# Patient Record
Sex: Male | Born: 1940
Health system: Southern US, Community
[De-identification: ages and names within clinical notes are randomized; demographics above are authoritative.]

## PROBLEM LIST (undated history)

## (undated) DIAGNOSIS — I8390 Asymptomatic varicose veins of unspecified lower extremity: Secondary | ICD-10-CM

## (undated) DIAGNOSIS — I2510 Atherosclerotic heart disease of native coronary artery without angina pectoris: Secondary | ICD-10-CM

## (undated) DIAGNOSIS — K5730 Diverticulosis of large intestine without perforation or abscess without bleeding: Secondary | ICD-10-CM

## (undated) DIAGNOSIS — Z87442 Personal history of urinary calculi: Secondary | ICD-10-CM

## (undated) DIAGNOSIS — M1990 Unspecified osteoarthritis, unspecified site: Secondary | ICD-10-CM

## (undated) DIAGNOSIS — J4489 Other specified chronic obstructive pulmonary disease: Secondary | ICD-10-CM

## (undated) DIAGNOSIS — I10 Essential (primary) hypertension: Secondary | ICD-10-CM

## (undated) DIAGNOSIS — F17200 Nicotine dependence, unspecified, uncomplicated: Secondary | ICD-10-CM

## (undated) DIAGNOSIS — J449 Chronic obstructive pulmonary disease, unspecified: Secondary | ICD-10-CM

## (undated) DIAGNOSIS — E7800 Pure hypercholesterolemia, unspecified: Secondary | ICD-10-CM

## (undated) DIAGNOSIS — F411 Generalized anxiety disorder: Secondary | ICD-10-CM

## (undated) DIAGNOSIS — D126 Benign neoplasm of colon, unspecified: Secondary | ICD-10-CM

## (undated) DIAGNOSIS — Z8619 Personal history of other infectious and parasitic diseases: Secondary | ICD-10-CM

## (undated) HISTORY — DX: Personal history of other infectious and parasitic diseases: Z86.19

## (undated) HISTORY — DX: Benign neoplasm of colon, unspecified: D12.6

## (undated) HISTORY — DX: Chronic obstructive pulmonary disease, unspecified: J44.9

## (undated) HISTORY — DX: Other specified chronic obstructive pulmonary disease: J44.89

## (undated) HISTORY — DX: Diverticulosis of large intestine without perforation or abscess without bleeding: K57.30

## (undated) HISTORY — PX: TONSILLECTOMY: SHX5217

## (undated) HISTORY — PX: HERNIA REPAIR: SHX51

## (undated) HISTORY — DX: Atherosclerotic heart disease of native coronary artery without angina pectoris: I25.10

## (undated) HISTORY — DX: Personal history of urinary calculi: Z87.442

## (undated) HISTORY — DX: Asymptomatic varicose veins of unspecified lower extremity: I83.90

## (undated) HISTORY — DX: Nicotine dependence, unspecified, uncomplicated: F17.200

## (undated) HISTORY — DX: Generalized anxiety disorder: F41.1

## (undated) HISTORY — DX: Unspecified osteoarthritis, unspecified site: M19.90

## (undated) HISTORY — DX: Pure hypercholesterolemia, unspecified: E78.00

## (undated) HISTORY — DX: Essential (primary) hypertension: I10

---

## 1977-07-19 HISTORY — PX: OTHER SURGICAL HISTORY: SHX169

## 1988-07-19 HISTORY — PX: CYSTOSCOPY: SUR368

## 1997-07-19 HISTORY — PX: OTHER SURGICAL HISTORY: SHX169

## 2000-12-02 ENCOUNTER — Other Ambulatory Visit: Admission: RE | Admit: 2000-12-02 | Discharge: 2000-12-02 | Payer: Self-pay | Admitting: Internal Medicine

## 2000-12-02 ENCOUNTER — Encounter (INDEPENDENT_AMBULATORY_CARE_PROVIDER_SITE_OTHER): Payer: Self-pay | Admitting: *Deleted

## 2004-06-03 ENCOUNTER — Ambulatory Visit: Payer: Self-pay | Admitting: Pulmonary Disease

## 2004-07-23 ENCOUNTER — Ambulatory Visit: Payer: Self-pay | Admitting: Pulmonary Disease

## 2004-08-14 ENCOUNTER — Ambulatory Visit: Payer: Self-pay | Admitting: Pulmonary Disease

## 2004-09-30 ENCOUNTER — Ambulatory Visit: Payer: Self-pay | Admitting: Cardiology

## 2004-10-09 ENCOUNTER — Ambulatory Visit: Payer: Self-pay

## 2005-09-15 ENCOUNTER — Ambulatory Visit: Payer: Self-pay | Admitting: Pulmonary Disease

## 2006-05-25 ENCOUNTER — Ambulatory Visit: Payer: Self-pay | Admitting: Pulmonary Disease

## 2006-06-22 ENCOUNTER — Ambulatory Visit: Payer: Self-pay | Admitting: Internal Medicine

## 2006-07-06 ENCOUNTER — Ambulatory Visit: Payer: Self-pay | Admitting: Internal Medicine

## 2006-10-06 ENCOUNTER — Ambulatory Visit: Payer: Self-pay | Admitting: Pulmonary Disease

## 2006-10-06 LAB — CONVERTED CEMR LAB
Albumin: 3.8 g/dL (ref 3.5–5.2)
Alkaline Phosphatase: 78 units/L (ref 39–117)
BUN: 16 mg/dL (ref 6–23)
Bilirubin, Direct: 0.2 mg/dL (ref 0.0–0.3)
CO2: 30 meq/L (ref 19–32)
Chloride: 105 meq/L (ref 96–112)
Creatinine, Ser: 0.8 mg/dL (ref 0.4–1.5)
Eosinophils Absolute: 0.3 10*3/uL (ref 0.0–0.6)
GFR calc Af Amer: 125 mL/min
Glucose, Bld: 95 mg/dL (ref 70–99)
HDL: 44.6 mg/dL (ref >39.0)
Hemoglobin, Urine: NEGATIVE
LDL Cholesterol: 76 mg/dL (ref 0–99)
Leukocytes, UA: NEGATIVE
Lymphocytes Relative: 28.4 % (ref 12.0–46.0)
MCHC: 35.2 g/dL (ref 30.0–36.0)
Neutro Abs: 4 10*3/uL (ref 1.4–7.7)
PSA: 0.77 ng/mL (ref 0.10–4.00)
RDW: 12.2 % (ref 11.5–14.6)
Sodium: 140 meq/L (ref 135–145)
Specific Gravity, Urine: 1.02 (ref 1.000–1.03)
Triglycerides: 45 mg/dL (ref 0–149)
Urine Glucose: NEGATIVE mg/dL
pH: 7 (ref 5.0–8.0)

## 2006-11-18 ENCOUNTER — Ambulatory Visit: Payer: Self-pay | Admitting: Pulmonary Disease

## 2007-05-15 ENCOUNTER — Ambulatory Visit: Payer: Self-pay | Admitting: Pulmonary Disease

## 2007-09-11 ENCOUNTER — Telehealth: Payer: Self-pay | Admitting: Pulmonary Disease

## 2007-11-29 ENCOUNTER — Encounter: Payer: Self-pay | Admitting: Pulmonary Disease

## 2007-11-30 DIAGNOSIS — F411 Generalized anxiety disorder: Secondary | ICD-10-CM

## 2007-11-30 DIAGNOSIS — E7800 Pure hypercholesterolemia, unspecified: Secondary | ICD-10-CM | POA: Insufficient documentation

## 2007-11-30 DIAGNOSIS — I2510 Atherosclerotic heart disease of native coronary artery without angina pectoris: Secondary | ICD-10-CM

## 2007-11-30 DIAGNOSIS — D126 Benign neoplasm of colon, unspecified: Secondary | ICD-10-CM

## 2007-11-30 DIAGNOSIS — J449 Chronic obstructive pulmonary disease, unspecified: Secondary | ICD-10-CM

## 2007-11-30 DIAGNOSIS — Z87442 Personal history of urinary calculi: Secondary | ICD-10-CM | POA: Insufficient documentation

## 2007-11-30 DIAGNOSIS — M1990 Unspecified osteoarthritis, unspecified site: Secondary | ICD-10-CM | POA: Insufficient documentation

## 2007-11-30 DIAGNOSIS — I10 Essential (primary) hypertension: Secondary | ICD-10-CM

## 2008-02-07 ENCOUNTER — Ambulatory Visit: Payer: Self-pay | Admitting: Pulmonary Disease

## 2008-02-20 DIAGNOSIS — I8390 Asymptomatic varicose veins of unspecified lower extremity: Secondary | ICD-10-CM | POA: Insufficient documentation

## 2008-02-20 DIAGNOSIS — K5730 Diverticulosis of large intestine without perforation or abscess without bleeding: Secondary | ICD-10-CM

## 2009-02-07 ENCOUNTER — Ambulatory Visit: Payer: Self-pay | Admitting: Pulmonary Disease

## 2010-01-21 ENCOUNTER — Telehealth (INDEPENDENT_AMBULATORY_CARE_PROVIDER_SITE_OTHER): Payer: Self-pay | Admitting: *Deleted

## 2010-03-02 ENCOUNTER — Ambulatory Visit: Payer: Self-pay | Admitting: Pulmonary Disease

## 2010-03-08 LAB — CONVERTED CEMR LAB
ALT: 19 units/L (ref 0–53)
AST: 22 units/L (ref 0–37)
Albumin: 4.6 g/dL (ref 3.5–5.2)
Basophils Relative: 0.5 % (ref 0.0–3.0)
Calcium: 9.6 mg/dL (ref 8.4–10.5)
Cholesterol: 160 mg/dL (ref 0–200)
Creatinine, Ser: 0.9 mg/dL (ref 0.4–1.5)
Glucose, Bld: 93 mg/dL (ref 70–99)
HCT: 45.6 % (ref 39.0–52.0)
Hemoglobin: 15.9 g/dL (ref 13.0–17.0)
LDL Cholesterol: 92 mg/dL (ref 0–99)
MCHC: 34.8 g/dL (ref 30.0–36.0)
Monocytes Relative: 7.5 % (ref 3.0–12.0)
Neutrophils Relative %: 66.7 % (ref 43.0–77.0)
Potassium: 5.3 meq/L — ABNORMAL HIGH (ref 3.5–5.1)
RBC: 4.7 M/uL (ref 4.22–5.81)
RDW: 13.2 % (ref 11.5–14.6)
Sodium: 142 meq/L (ref 135–145)
TSH: 0.74 microintl units/mL (ref 0.35–5.50)
Total CHOL/HDL Ratio: 3
VLDL: 20.4 mg/dL (ref 0.0–40.0)

## 2010-03-09 DIAGNOSIS — I4949 Other premature depolarization: Secondary | ICD-10-CM

## 2010-03-11 ENCOUNTER — Telehealth: Payer: Self-pay | Admitting: Pulmonary Disease

## 2010-03-12 ENCOUNTER — Ambulatory Visit: Payer: Self-pay | Admitting: Internal Medicine

## 2010-03-12 DIAGNOSIS — R9431 Abnormal electrocardiogram [ECG] [EKG]: Secondary | ICD-10-CM

## 2010-03-18 ENCOUNTER — Telehealth (INDEPENDENT_AMBULATORY_CARE_PROVIDER_SITE_OTHER): Payer: Self-pay | Admitting: *Deleted

## 2010-03-19 ENCOUNTER — Encounter: Payer: Self-pay | Admitting: Cardiology

## 2010-03-19 ENCOUNTER — Ambulatory Visit: Payer: Self-pay | Admitting: Cardiology

## 2010-03-19 ENCOUNTER — Encounter (HOSPITAL_COMMUNITY): Admission: RE | Admit: 2010-03-19 | Discharge: 2010-04-13 | Payer: Self-pay | Admitting: Internal Medicine

## 2010-03-19 ENCOUNTER — Ambulatory Visit: Payer: Self-pay | Admitting: Internal Medicine

## 2010-03-19 ENCOUNTER — Ambulatory Visit: Payer: Self-pay

## 2010-03-30 ENCOUNTER — Encounter: Payer: Self-pay | Admitting: Internal Medicine

## 2010-04-30 ENCOUNTER — Encounter: Payer: Self-pay | Admitting: Pulmonary Disease

## 2010-06-19 ENCOUNTER — Emergency Department (HOSPITAL_BASED_OUTPATIENT_CLINIC_OR_DEPARTMENT_OTHER): Admission: EM | Admit: 2010-06-19 | Discharge: 2010-06-19 | Payer: Self-pay | Admitting: Emergency Medicine

## 2010-06-19 ENCOUNTER — Telehealth: Payer: Self-pay | Admitting: Pulmonary Disease

## 2010-07-09 ENCOUNTER — Telehealth: Payer: Self-pay | Admitting: Pulmonary Disease

## 2010-07-11 ENCOUNTER — Ambulatory Visit: Admit: 2010-07-11 | Payer: Self-pay | Admitting: Family Medicine

## 2010-07-14 ENCOUNTER — Telehealth: Payer: Self-pay | Admitting: Pulmonary Disease

## 2010-07-14 ENCOUNTER — Encounter: Payer: Self-pay | Admitting: Pulmonary Disease

## 2010-08-16 LAB — CONVERTED CEMR LAB
ALT: 18 units/L (ref 0–53)
Albumin: 4.5 g/dL (ref 3.5–5.2)
Alkaline Phosphatase: 86 units/L (ref 39–117)
BUN: 21 mg/dL (ref 6–23)
Basophils Absolute: 0 10*3/uL (ref 0.0–0.1)
Basophils Absolute: 0.1 10*3/uL (ref 0.0–0.1)
Basophils Relative: 0.7 % (ref 0.0–3.0)
Bilirubin Urine: NEGATIVE
Bilirubin, Direct: 0.2 mg/dL (ref 0.0–0.3)
Calcium: 9.4 mg/dL (ref 8.4–10.5)
Chloride: 106 meq/L (ref 96–112)
Cholesterol: 144 mg/dL (ref 0–200)
Cholesterol: 150 mg/dL (ref 0–200)
Eosinophils Absolute: 0.6 10*3/uL (ref 0.0–0.7)
Eosinophils Absolute: 0.7 10*3/uL (ref 0.0–0.7)
Eosinophils Relative: 5.7 % — ABNORMAL HIGH (ref 0.0–5.0)
GFR calc Af Amer: 96 mL/min
GFR calc non Af Amer: 79.17 mL/min (ref >60)
Glucose, Bld: 106 mg/dL — ABNORMAL HIGH (ref 70–99)
HDL: 43.6 mg/dL (ref >39.0)
Hemoglobin, Urine: NEGATIVE
Hemoglobin: 14.7 g/dL (ref 13.0–17.0)
LDL Cholesterol: 85 mg/dL (ref 0–99)
Lymphocytes Relative: 24.1 % (ref 12.0–46.0)
Lymphs Abs: 2.1 10*3/uL (ref 0.7–4.0)
MCHC: 33.8 g/dL (ref 30.0–36.0)
MCHC: 34.5 g/dL (ref 30.0–36.0)
MCV: 95.2 fL (ref 78.0–100.0)
Monocytes Absolute: 0.8 10*3/uL (ref 0.1–1.0)
Monocytes Absolute: 0.9 10*3/uL (ref 0.1–1.0)
Monocytes Relative: 9.1 % (ref 3.0–12.0)
Neutro Abs: 5.9 10*3/uL (ref 1.4–7.7)
Neutrophils Relative %: 58.3 % (ref 43.0–77.0)
Neutrophils Relative %: 60.8 % (ref 43.0–77.0)
PSA: 1.58 ng/mL (ref 0.10–4.00)
PSA: 3.17 ng/mL (ref 0.10–4.00)
Platelets: 205 10*3/uL (ref 150.0–400.0)
Potassium: 5 meq/L (ref 3.5–5.1)
RBC: 4.52 M/uL (ref 4.22–5.81)
RBC: 4.81 M/uL (ref 4.22–5.81)
RDW: 12.8 % (ref 11.5–14.6)
Specific Gravity, Urine: 1.02 (ref 1.000–1.03)
TSH: 0.56 microintl units/mL (ref 0.35–5.50)
Total Bilirubin: 1.3 mg/dL — ABNORMAL HIGH (ref 0.3–1.2)
Total CHOL/HDL Ratio: 3
Total Protein, Urine: NEGATIVE mg/dL
Triglycerides: 63 mg/dL (ref 0.0–149.0)
Urine Glucose: NEGATIVE mg/dL
VLDL: 12.6 mg/dL (ref 0.0–40.0)
WBC: 8.8 10*3/uL (ref 4.5–10.5)
WBC: 9.8 10*3/uL (ref 4.5–10.5)
pH: 6.5 (ref 5.0–8.0)

## 2010-08-18 NOTE — Progress Notes (Signed)
Summary: Nuclear pre procedure  Phone Note Outgoing Call Call back at [facility] Phone [phone]   Call placed by: Rea College, CMA,  March 18, 2010 4:05 PM Call placed to: Patient Summary of Call: Left message with information on Myoview Information Sheet (see scanned document for details).      Nuclear Med Background Indications for Stress Test: Evaluation for Ischemia, Abnormal EKG   History: COPD, Heart Catheterization, Myocardial Perfusion Study  History Comments: '06 JYN:[id number]  Symptoms: Chest Pain, DOE    Nuclear Pre-Procedure Cardiac Risk Factors: Hypertension, Lipids, Smoker Height (in): 72
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
## 2010-08-18 NOTE — Procedures (Signed)
Summary: summary Report  summary Report   Imported By: Erle Crocker 04/23/2010 13:11:35  _____________________________________________________________________  External Attachment:    Type:   Image     Comment:   External Document  Appended Document: summary Report adv pt results normal. pt wants to talk to dr. Graciela Husbands. Claris Gladden, RN, BSN

## 2010-08-18 NOTE — Progress Notes (Signed)
Summary: cardio appt  Phone Note Call from Patient Call back at Home Phone [phone]   Caller: Olegario Messier or Simona Huh Call For: nadel Reason for Call: Talk to Nurse Summary of Call: pt's wife comcerned that p's EKG was abnormal and cardiology appt was made for 08/30.  Cardiology has called to set pt's appointment back further into September.  Wife feels they shouldn't wait.  Can you do anything to get them a sooner appt.  They offered 09/09/ or 09/14 Initial call taken by: Eugene Gavia,  March 11, 2010 9:48 AM  Follow-up for Phone Call        Dr Kriste Basque, pt's wife wants him seen sooner by cards- they offered 9/14 appt- do you feel this is needed for him to be seen there sooner than this date? Pls advise, thanks! Follow-up by: Vernie Murders,  March 11, 2010 9:52 AM  Additional Follow-up for Phone Call Additional follow up Details #1::        rhonda called and got pt an appt for 8-25 and she will call them with the appt date and time [address] [facility]  March 11, 2010 2:55 PM      Appended Document: cardio appt Called pt to inform him that appt has been scheduled for tomorrow Thurs. 03/12/10 at 12:00 with Dr. Clide Cliff at [facility].  LMOAM of above message and ask that someone return my call to let me know that they are aware of this appt.

## 2010-08-18 NOTE — Progress Notes (Signed)
Summary: cyst / infection  Phone Note Call from Patient Call back at [facility] Phone [phone]   Caller: Patient Call For: nadel Summary of Call: pt c/o cyst on L shoulder (that pt has had for 20 yrs). cyst may be infected x 1 day- red/ warm to the touch with puss cyst is the size of his thumbnail. requests abx- [facility] [address]  Initial call taken by: Tivis Ringer, CNA,  June 19, 2010 2:12 PM  Follow-up for Phone Call        spoke wuith pt and he staes he has a cyst on his left shoulder that he has had for 20 yrs, but  yesterday he noticed it was red, warm to the touch, sore to touch, and he was able to squeeze some yellow puss out of it. He states he has been using warm salt water on it which has helped the soreness some. Pt states he has an appt with a general surgeon in a few months to have cyst removed. he is requesting an antiobiotic for infection. please advsie.Carron Curie CMA  June 19, 2010 4:00 PM   Additional Follow-up for Phone Call Additional follow up Details #1::        per SN---use hot soaks, keflex 500mg   #28  1 by mouth four times daily, if he feels like it needs to be lanced he can come in and see TP or with his surgeon.  thanks [address] [facility]  June 22, 2010 9:19 AM  lmomtcb   Philipp Deputy [facility]  June 23, 2010 4:58 PM     Additional Follow-up for Phone Call Additional follow up Details #2::    LMOMTCB Vernie Murders  June 22, 2010 9:27 AM    lmomtcb [address] [facility]  June 23, 2010 12:24 PM   lmtcb   Philipp Deputy [facility]  June 23, 2010 5:04 PM  Pt called back. says "no one called in any meds for him". he says he waited until 8:30 pm. he then went to [place]tr [place] and "was taken care of". pt just wanted to leave this msg for the nurse. he says he called back yesterday after 5pm. Tivis Ringer, CNA  June 24, 2010 9:06 AM  Additional Follow-up for Phone Call Additional follow up Details #3:: Details for Additional Follow-up Action  Taken: per pt nothing further needed.Carron Curie CMA  June 24, 2010 9:31 AM

## 2010-08-18 NOTE — Assessment & Plan Note (Addendum)
Summary: Cardiology Nuclear Testing  Nuclear Med Background Indications for Stress Test: Evaluation for Ischemia, Abnormal EKG   History: COPD, Heart Catheterization, Myocardial Perfusion Study  History Comments: '06 [id number]  Symptoms: Chest Pain, DOE, Palpitations    Nuclear Pre-Procedure Cardiac Risk Factors: Hypertension, Lipids, Smoker Caffeine/Decaff Intake: None NPO After: 9:30 PM Lungs: clear IV 0.9% NS with Angio Cath: 22g     IV Site: L Antecubital IV Started by: Irean Hong, RN Chest Size (in) 42     Height (in): 72 Weight (lb): 173 BMI: 23.55 Tech Comments: Held metoprolol 24 hrs.  Nuclear Med Study 1 or 2 day study:  1 day     Stress Test Type:  Stress Reading MD:  Olga Millers, MD     Referring MD:  S.Klein Resting Radionuclide:  Technetium 59m Tetrofosmin     Resting Radionuclide Dose:  11.0 mCi  Stress Radionuclide:  Technetium 17m Tetrofosmin     Stress Radionuclide Dose:  33.0 mCi   Stress Protocol Exercise Time (min):  7:33 min     Max HR:  136 bpm     Predicted Max HR:  152 bpm  Max Systolic BP: 181 mm Hg     Percent Max HR:  89.47 %     METS: 9.40 Rate Pressure Product:  66440    Stress Test Technologist:  Milana Na, EMT-P     Nuclear Technologist:  Domenic Polite, CNMT  Rest Procedure  Myocardial perfusion imaging was performed at rest 45 minutes following the intravenous administration of Technetium 25m Tetrofosmin.  Stress Procedure  The patient exercised for 7:33. The patient stopped due to fatigue and denied any chest pain.  There were no significant ST-T wave changes and freq pcvs/Bigemeny,Trigemeny.  Technetium 15m Tetrofosmin was injected at peak exercise and myocardial perfusion imaging was performed after a brief delay.  QPS Raw Data Images:  Acquisition technically good; normal left ventricular size. Stress Images:  There is decreased uptake in the inferior wall Rest Images:  There is decreased uptake in the inferior  wall. Subtraction (SDS):  No evidence of ischemia. Transient Ischemic Dilatation:  .93  (Normal <1.22)  Lung/Heart Ratio:  .31  (Normal <0.45)  Quantitative Gated Spect Images QGS EDV:  117 ml QGS ESV:  50 ml QGS EF:  57 % QGS cine images:  Normal wall motion.   Overall Impression  Exercise Capacity: Fair exercise capacity. BP Response: Normal blood pressure response. Clinical Symptoms: No chest pain ECG Impression: No significant ST segment change suggestive of ischemia; frequent pvcs and rare couplet. Overall Impression: Abnormal stress nuclear study with prior inferior infarct but no ischemia.  Appended Document: Cardiology Nuclear Testing RNA at both pt numbers. Claris Gladden, RN, BSN    Appended Document: Cardiology Nuclear Testing RNA at pt number. Claris Gladden, RN, BSN    Appended Document: Cardiology Nuclear Testing rna at pt home number.Claris Gladden, RN, BSN   04/14/10 1342  Appended Document: Cardiology Nuclear Testing adv pt of results and they expressed concern/dissapproval of not being notified before now. still concerned about irreg hr-r/t pvcs. had 24 holter monitor results reprinted and see several pvcs. records folks gone for day so unable to retrieve original to review notes from Dr. Graciela Husbands. Will discuss this w/him on Friday 10/7 Claris Gladden, RN, BSN
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
## 2010-08-18 NOTE — Miscellaneous (Signed)
Summary: med change--symbicort to qvar  Clinical Lists Changes  Medications: Changed medication from SYMBICORT 160-4.5 MCG/ACT  AERO (BUDESONIDE-FORMOTEROL FUMARATE) inhale 2 sprays two times a day... to QVAR 80 MCG/ACT AERS (BECLOMETHASONE DIPROPIONATE) 2 puffs two times a day - Signed Rx of QVAR 80 MCG/ACT AERS (BECLOMETHASONE DIPROPIONATE) 2 puffs two times a day;  #1 x 11;  Signed;  Entered by: Randell Loop CMA;  Authorized by: Michele Mcalpine MD;  Method used: Electronically to [facility]  [address] [id number] [address], [place], [place]  [address], Ph: [phone] or [phone], Fax: [phone]    called and spoke with pt about per SN and Dr. Barbarann Ehlers will change the symbicort to qvar 80  two times a day ---this has been sent to the pt pharmacy and pt is aware of med change Randell Loop [facility]  April 30, 2010 5:14 PM   Prescriptions: QVAR 80 MCG/ACT AERS (BECLOMETHASONE DIPROPIONATE) 2 puffs two times a day  #1 x 11   Entered by:   Randell Loop CMA   Authorized by:   Michele Mcalpine MD   Signed by:   Randell Loop CMA on 04/30/2010   Method used:   [facility]ronically to        [facility]  [address] [id number] (retail)       [address]       [place], [place]  [address]       Ph: [phone] or [phone]       Fax: [phone]   RxID:   [id number]

## 2010-08-18 NOTE — Progress Notes (Signed)
  Phone Note Other Incoming   Request: Send information Summary of Call: Request for records received from [facility]. Request forwarded to [facility].     

## 2010-08-20 NOTE — Progress Notes (Signed)
Summary: sick  Phone Note Call from Patient Call back at Home Phone [phone]   Caller: Patient Call For: nadel Reason for Call: Talk to Nurse Summary of Call: Patient calling c/o sinus issues, cough w/ yellow sputum, yellow mucus.  Asking for antibiotic.  [facility] [address] Initial call taken by: Lehman Prom,  July 09, 2010 10:35 AM  Follow-up for Phone Call        Spoke with pt.  He is c/o "stuffy head", sinus pressure, dry cough- worse at night, and yellow nasal d/c- onset was 4 days ago. Wants something called in. Wants cough med also to help him sleep since cough worse at bedtime. Allergic to prednisone only. Pls advise thanks Follow-up by: Vernie Murders,  July 09, 2010 2:03 PM  Additional Follow-up for Phone Call Additional follow up Details #1::        called and spoke with pt and he is aware per SN to take the augmentin that has been sent to the pharamcy and pt is to use mucinex 2 by mouth two times a day with plenty of fluids and nasal saline mist as needed .  pt voiced his understanding of this Randell Loop CMA  July 09, 2010 5:13 PM     New/Updated Medications: AUGMENTIN 875-125 MG TABS (AMOXICILLIN-POT CLAVULANATE) take one tablet by mouth two times a day until gone Prescriptions: AUGMENTIN 875-125 MG TABS (AMOXICILLIN-POT CLAVULANATE) take one tablet by mouth two times a day until gone  #14 x 0   Entered by:   Randell Loop CMA   Authorized by:   Michele Mcalpine MD   Signed by:   Randell Loop CMA on 07/09/2010   Method used:   [facility] to        [facility]  [address] [id number]       [address]       [place], [place]  [address]       Ph: [phone] or [phone]       Fax: [phone]   RxID:   [id number]

## 2010-08-20 NOTE — Letter (Signed)
Summary: Internal Correspondence  Internal Correspondence   Imported By: Valinda Hoar 07/14/2010 15:45:09  _____________________________________________________________________  External Attachment:    Type:   Image     Comment:   External Document

## 2010-08-20 NOTE — Progress Notes (Signed)
Summary: see note  Phone Note Call from Patient Call back at Home Phone [phone]   Caller: Patient Call For: nadel Summary of Call: see internal correspondance note dated 12/27 with [facility] name on it please advise Initial call taken by: Lacinda Axon,  July 14, 2010 3:46 PM  Follow-up for Phone Call        Attached are the call reports from over the weekend call regarding the patient-please advise.Reynaldo Minium CMA  July 14, 2010 3:56 PM   Additional Follow-up for Phone Call Additional follow up Details #1::        lmomtcb x 2 to see how pt is doing? per SN Randell Loop CMA  July 15, 2010 3:42 PM     Additional Follow-up for Phone Call Additional follow up Details #2::    lmomtcb---many messages have been left for pt to contact office to see how he is doing with no contact ever made.  will sign off on message and wait for pt to return call. Randell Loop [facility]  July 17, 2010 1:46 PM

## 2010-08-28 ENCOUNTER — Ambulatory Visit (INDEPENDENT_AMBULATORY_CARE_PROVIDER_SITE_OTHER): Payer: Medicare Other | Admitting: Internal Medicine

## 2010-08-28 ENCOUNTER — Encounter: Payer: Self-pay | Admitting: Internal Medicine

## 2010-08-28 DIAGNOSIS — R072 Precordial pain: Secondary | ICD-10-CM

## 2010-08-28 DIAGNOSIS — I4949 Other premature depolarization: Secondary | ICD-10-CM

## 2010-08-28 DIAGNOSIS — R9431 Abnormal electrocardiogram [ECG] [EKG]: Secondary | ICD-10-CM

## 2010-09-01 ENCOUNTER — Telehealth: Payer: Self-pay | Admitting: Pulmonary Disease
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
## 2010-09-09 NOTE — Progress Notes (Signed)
  Phone Note Refill Request Message from:  Fax from Pharmacy  Refills Requested: Medication #1:  TEMAZEPAM 30 MG  CAPS 1 by mouth at bedtime as needed for sleep Initial call taken by: Zackery Barefoot CMA,  September 01, 2010 9:37 AM    Prescriptions: TEMAZEPAM 30 MG  CAPS (TEMAZEPAM) 1 by mouth at bedtime as needed for sleep  #30 x 5   Entered by:   Zackery Barefoot CMA   Authorized by:   Michele Mcalpine MD   Signed by:   Zackery Barefoot CMA on 09/01/2010   Method used:   Telephoned to ...       [facility]  [id number] (retail)       [address]       [place], [place]  [address]       Ph: [phone] or [phone]       Fax: [phone]   RxID:   [id number]

## 2010-09-29 LAB — WOUND CULTURE: Culture: NO GROWTH

## 2011-02-12 ENCOUNTER — Other Ambulatory Visit: Payer: Self-pay | Admitting: Pulmonary Disease

## 2011-02-12 MED ORDER — ALPRAZOLAM 0.5 MG PO TABS: ORAL_TABLET | ORAL | Status: DC

## 2011-02-12 NOTE — Telephone Encounter (Signed)
Pt is scheduled for f/u with SN on 03/03/2011. Will refill for # 90 with no refills.

## 2011-03-03 ENCOUNTER — Ambulatory Visit (INDEPENDENT_AMBULATORY_CARE_PROVIDER_SITE_OTHER)
Admission: RE | Admit: 2011-03-03 | Discharge: 2011-03-03 | Disposition: A | Discharge status: Home / Self Care | Payer: Medicare Other | Source: Ambulatory Visit | Attending: Pulmonary Disease | Admitting: Pulmonary Disease

## 2011-03-03 ENCOUNTER — Other Ambulatory Visit (INDEPENDENT_AMBULATORY_CARE_PROVIDER_SITE_OTHER): Payer: Medicare Other

## 2011-03-03 ENCOUNTER — Encounter: Payer: Self-pay | Admitting: Pulmonary Disease

## 2011-03-03 ENCOUNTER — Ambulatory Visit (INDEPENDENT_AMBULATORY_CARE_PROVIDER_SITE_OTHER): Payer: Medicare Other | Admitting: Pulmonary Disease

## 2011-03-03 DIAGNOSIS — F411 Generalized anxiety disorder: Secondary | ICD-10-CM

## 2011-03-03 DIAGNOSIS — N139 Obstructive and reflux uropathy, unspecified: Secondary | ICD-10-CM

## 2011-03-03 DIAGNOSIS — J449 Chronic obstructive pulmonary disease, unspecified: Secondary | ICD-10-CM

## 2011-03-03 DIAGNOSIS — Z87442 Personal history of urinary calculi: Secondary | ICD-10-CM

## 2011-03-03 DIAGNOSIS — R9431 Abnormal electrocardiogram [ECG] [EKG]: Secondary | ICD-10-CM

## 2011-03-03 DIAGNOSIS — E7800 Pure hypercholesterolemia, unspecified: Secondary | ICD-10-CM

## 2011-03-03 DIAGNOSIS — F17200 Nicotine dependence, unspecified, uncomplicated: Secondary | ICD-10-CM

## 2011-03-03 DIAGNOSIS — I10 Essential (primary) hypertension: Secondary | ICD-10-CM

## 2011-03-03 DIAGNOSIS — K5730 Diverticulosis of large intestine without perforation or abscess without bleeding: Secondary | ICD-10-CM

## 2011-03-03 DIAGNOSIS — M1990 Unspecified osteoarthritis, unspecified site: Secondary | ICD-10-CM

## 2011-03-03 DIAGNOSIS — I4949 Other premature depolarization: Secondary | ICD-10-CM

## 2011-03-03 DIAGNOSIS — D126 Benign neoplasm of colon, unspecified: Secondary | ICD-10-CM

## 2011-03-03 DIAGNOSIS — I2510 Atherosclerotic heart disease of native coronary artery without angina pectoris: Secondary | ICD-10-CM

## 2011-03-03 DIAGNOSIS — G4700 Insomnia, unspecified: Secondary | ICD-10-CM | POA: Insufficient documentation

## 2011-03-03 DIAGNOSIS — I8390 Asymptomatic varicose veins of unspecified lower extremity: Secondary | ICD-10-CM

## 2011-03-03 LAB — TSH: TSH: 0.82 u[IU]/mL (ref 0.35–5.50)

## 2011-03-03 LAB — CBC WITH DIFFERENTIAL/PLATELET
Basophils Absolute: 0 10*3/uL (ref 0.0–0.1)
HCT: 43.8 % (ref 39.0–52.0)
Lymphs Abs: 2.1 10*3/uL (ref 0.7–4.0)
MCV: 96.6 fl (ref 78.0–100.0)
Monocytes Absolute: 0.8 10*3/uL (ref 0.1–1.0)
Platelets: 228 10*3/uL (ref 150.0–400.0)
RDW: 13.3 % (ref 11.5–14.6)

## 2011-03-03 LAB — LIPID PANEL
LDL Cholesterol: 82 mg/dL (ref 0–99)
VLDL: 20.6 mg/dL (ref 0.0–40.0)

## 2011-03-03 LAB — PSA: PSA: 1.34 ng/mL (ref 0.10–4.00)

## 2011-03-03 LAB — HEPATIC FUNCTION PANEL
AST: 20 U/L (ref 0–37)
Alkaline Phosphatase: 92 U/L (ref 39–117)
Total Bilirubin: 1.1 mg/dL (ref 0.3–1.2)

## 2011-03-03 LAB — BASIC METABOLIC PANEL
GFR: 71.24 mL/min (ref >60.00)
Potassium: 4.9 mEq/L (ref 3.5–5.1)
Sodium: 141 mEq/L (ref 135–145)

## 2011-03-03 MED ORDER — NABUMETONE 750 MG PO TABS: 750.0000 mg | ORAL_TABLET | Freq: Two times a day (BID) | ORAL | Status: DC | PRN

## 2011-03-03 MED ORDER — ALPRAZOLAM 0.5 MG PO TABS: ORAL_TABLET | ORAL | Status: DC

## 2011-03-03 MED ORDER — TEMAZEPAM 30 MG PO CAPS: 30.0000 mg | ORAL_CAPSULE | Freq: Every evening | ORAL | Status: DC | PRN

## 2011-03-03 MED ORDER — METOPROLOL SUCCINATE ER 50 MG PO TB24: 50.0000 mg | ORAL_TABLET | Freq: Every day | ORAL | Status: DC

## 2011-03-03 MED ORDER — ATORVASTATIN CALCIUM 10 MG PO TABS: ORAL_TABLET | ORAL | Status: DC

## 2011-03-03 NOTE — Patient Instructions (Signed)
Today we updated your med list in EPIC...    We refilled your meds as requested...  Jerome Craig, you have significant heart & lung disease, we are doing everything possible to prevent cumulative damage to your system over time, you need to quit the smoking to help yourself, let me know if you don't think you can quit on your own & if you want a prescription for Chantix... Call the 1-800-QUIT NOW phone number for additional help...  Today we did your follow up CXR, PFT, & fasting blood work...    Please call the PHONE TREE in a few days for your results...    Dial [id number] & when prompted enter your patient number followed by the # symbol...    Your patient number is:  [id number]#  Stay on your low cholesterol low fat diet & keep up the good work w/ exercise...  Call for any questions...  Let's plan a follow up eval in 1 year, sooner if needed for problems.Marland KitchenMarland Kitchen
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
## 2011-03-05 ENCOUNTER — Encounter: Payer: Self-pay | Admitting: Pulmonary Disease

## 2011-03-08 ENCOUNTER — Telehealth: Payer: Self-pay | Admitting: *Deleted

## 2011-03-08 NOTE — Telephone Encounter (Signed)
Per SN, PFT with moderate/severe airflow obstruction. Must quit smoking. Also rec start asmanex 2 sprays once daily and spiriva 1 capsule inhaled daily. I advised the pt of SN recs and he states that he would rather take the Qvar that he has 2 puffs twice daily. He states he was only taking it one puff every few days. He also states that he stopped smoking on 02-28-11. He request to try Qvar for 1 month to see how it does. Please advise. Carron Curie, CMA

## 2011-03-08 NOTE — Telephone Encounter (Signed)
Per SN ok to use Qvar but he also needs to use the spiriva daily in order to treat COPD. Pt states understanding. I have left a sample at front and advised the pt to ask for me so I can show him how to use the spiriva. Carron Curie, CMA

## 2011-08-30 ENCOUNTER — Other Ambulatory Visit: Payer: Self-pay | Admitting: Pulmonary Disease

## 2011-08-31 ENCOUNTER — Other Ambulatory Visit: Payer: Self-pay | Admitting: Pulmonary Disease

## 2011-09-01 ENCOUNTER — Telehealth: Payer: Self-pay | Admitting: Pulmonary Disease

## 2011-09-01 MED ORDER — TEMAZEPAM 30 MG PO CAPS: 30.0000 mg | ORAL_CAPSULE | Freq: Every evening | ORAL | Status: DC | PRN

## 2011-09-01 NOTE — Telephone Encounter (Signed)
Refill of the temazepam has been called to his pharmacy.  Called and spoke with pt and he is aware of meds called to the pharmacy.

## 2011-09-26 ENCOUNTER — Other Ambulatory Visit: Payer: Self-pay | Admitting: Pulmonary Disease

## 2011-10-06 DIAGNOSIS — D485 Neoplasm of uncertain behavior of skin: Secondary | ICD-10-CM | POA: Diagnosis not present

## 2011-10-06 DIAGNOSIS — H40059 Ocular hypertension, unspecified eye: Secondary | ICD-10-CM | POA: Diagnosis not present

## 2011-10-06 DIAGNOSIS — H25019 Cortical age-related cataract, unspecified eye: Secondary | ICD-10-CM | POA: Diagnosis not present

## 2011-10-06 DIAGNOSIS — H40019 Open angle with borderline findings, low risk, unspecified eye: Secondary | ICD-10-CM | POA: Diagnosis not present

## 2011-10-07 ENCOUNTER — Other Ambulatory Visit: Payer: Self-pay | Admitting: Pulmonary Disease

## 2011-10-19 ENCOUNTER — Emergency Department (HOSPITAL_BASED_OUTPATIENT_CLINIC_OR_DEPARTMENT_OTHER)
Admission: EM | Admit: 2011-10-19 | Discharge: 2011-10-19 | Disposition: A | Discharge status: Home / Self Care | Payer: Medicare Other | Attending: Emergency Medicine | Admitting: Emergency Medicine

## 2011-10-19 ENCOUNTER — Encounter (HOSPITAL_BASED_OUTPATIENT_CLINIC_OR_DEPARTMENT_OTHER): Payer: Self-pay | Admitting: *Deleted

## 2011-10-19 DIAGNOSIS — I10 Essential (primary) hypertension: Secondary | ICD-10-CM | POA: Diagnosis not present

## 2011-10-19 DIAGNOSIS — M1990 Unspecified osteoarthritis, unspecified site: Secondary | ICD-10-CM | POA: Diagnosis not present

## 2011-10-19 DIAGNOSIS — Z7982 Long term (current) use of aspirin: Secondary | ICD-10-CM | POA: Insufficient documentation

## 2011-10-19 DIAGNOSIS — J0110 Acute frontal sinusitis, unspecified: Secondary | ICD-10-CM | POA: Diagnosis not present

## 2011-10-19 DIAGNOSIS — J329 Chronic sinusitis, unspecified: Secondary | ICD-10-CM | POA: Diagnosis not present

## 2011-10-19 DIAGNOSIS — Z87891 Personal history of nicotine dependence: Secondary | ICD-10-CM | POA: Diagnosis not present

## 2011-10-19 DIAGNOSIS — J0100 Acute maxillary sinusitis, unspecified: Secondary | ICD-10-CM | POA: Diagnosis not present

## 2011-10-19 DIAGNOSIS — E7800 Pure hypercholesterolemia, unspecified: Secondary | ICD-10-CM | POA: Insufficient documentation

## 2011-10-19 MED ORDER — OXYMETAZOLINE HCL 0.05 % NA SOLN: 2.0000 | Freq: Two times a day (BID) | NASAL | Status: AC

## 2011-10-19 MED ORDER — CEFUROXIME AXETIL 250 MG PO TABS: 250.0000 mg | ORAL_TABLET | Freq: Two times a day (BID) | ORAL | Status: AC

## 2011-10-19 NOTE — ED Notes (Signed)
Pt. Reports sinus congestion with nose bleeds and sore eyes for approx. 17 days

## 2011-10-19 NOTE — Discharge Instructions (Signed)
Sinusitis Sinuses are air pockets within the bones of your face. The growth of bacteria within a sinus leads to infection. The infection prevents the sinuses from draining. This infection is called sinusitis. SYMPTOMS  There will be different areas of pain depending on which sinuses have become infected.  The maxillary sinuses often produce pain beneath the eyes.   Frontal sinusitis may cause pain in the middle of the forehead and above the eyes.  Other problems (symptoms) include:  Toothaches.   Colored, pus-like (purulent) drainage from the nose.   Swelling, warmth, and tenderness over the sinus areas may be signs of infection.  TREATMENT  Sinusitis is most often determined by an exam.X-rays may be taken. If x-rays have been taken, make sure you obtain your results or find out how you are to obtain them. Your caregiver may give you medications (antibiotics). These are medications that will help kill the bacteria causing the infection. You may also be given a medication (decongestant) that helps to reduce sinus swelling.  HOME CARE INSTRUCTIONS   Only take over-the-counter or prescription medicines for pain, discomfort, or fever as directed by your caregiver.   Drink extra fluids. Fluids help thin the mucus so your sinuses can drain more easily.   Applying either moist heat or ice packs to the sinus areas may help relieve discomfort.   Use saline nasal sprays to help moisten your sinuses. The sprays can be found at your local drugstore.  SEEK IMMEDIATE MEDICAL CARE IF:  You have a fever.   You have increasing pain, severe headaches, or toothache.   You have nausea, vomiting, or drowsiness.   You develop unusual swelling around the face or trouble seeing.  MAKE SURE YOU:   Understand these instructions.   Will watch your condition.   Will get help right away if you are not doing well or get worse.  Document Released: 07/05/2005 Document Revised: 06/24/2011 Document Reviewed:  02/01/2007 [facility] Patient Information 2012 [place], [place].
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
## 2012-03-02 ENCOUNTER — Ambulatory Visit (INDEPENDENT_AMBULATORY_CARE_PROVIDER_SITE_OTHER)
Admission: RE | Admit: 2012-03-02 | Discharge: 2012-03-02 | Disposition: A | Discharge status: Home / Self Care | Payer: Medicare Other | Source: Ambulatory Visit | Attending: Pulmonary Disease | Admitting: Pulmonary Disease

## 2012-03-02 ENCOUNTER — Encounter: Payer: Self-pay | Admitting: Pulmonary Disease

## 2012-03-02 ENCOUNTER — Other Ambulatory Visit (INDEPENDENT_AMBULATORY_CARE_PROVIDER_SITE_OTHER): Payer: Medicare Other

## 2012-03-02 ENCOUNTER — Ambulatory Visit (INDEPENDENT_AMBULATORY_CARE_PROVIDER_SITE_OTHER): Payer: Medicare Other | Admitting: Pulmonary Disease

## 2012-03-02 VITALS — BP 146/82 | HR 75 | Temp 96.9°F | Ht 72.0 in | Wt 176.0 lb

## 2012-03-02 DIAGNOSIS — E7800 Pure hypercholesterolemia, unspecified: Secondary | ICD-10-CM

## 2012-03-02 DIAGNOSIS — N320 Bladder-neck obstruction: Secondary | ICD-10-CM

## 2012-03-02 DIAGNOSIS — G4700 Insomnia, unspecified: Secondary | ICD-10-CM

## 2012-03-02 DIAGNOSIS — N139 Obstructive and reflux uropathy, unspecified: Secondary | ICD-10-CM

## 2012-03-02 DIAGNOSIS — J449 Chronic obstructive pulmonary disease, unspecified: Secondary | ICD-10-CM

## 2012-03-02 DIAGNOSIS — I10 Essential (primary) hypertension: Secondary | ICD-10-CM | POA: Diagnosis not present

## 2012-03-02 DIAGNOSIS — M1990 Unspecified osteoarthritis, unspecified site: Secondary | ICD-10-CM

## 2012-03-02 DIAGNOSIS — D126 Benign neoplasm of colon, unspecified: Secondary | ICD-10-CM | POA: Diagnosis not present

## 2012-03-02 DIAGNOSIS — F411 Generalized anxiety disorder: Secondary | ICD-10-CM | POA: Diagnosis not present

## 2012-03-02 DIAGNOSIS — I2510 Atherosclerotic heart disease of native coronary artery without angina pectoris: Secondary | ICD-10-CM | POA: Diagnosis not present

## 2012-03-02 DIAGNOSIS — I8390 Asymptomatic varicose veins of unspecified lower extremity: Secondary | ICD-10-CM | POA: Diagnosis not present

## 2012-03-02 DIAGNOSIS — K5730 Diverticulosis of large intestine without perforation or abscess without bleeding: Secondary | ICD-10-CM

## 2012-03-02 DIAGNOSIS — Z87442 Personal history of urinary calculi: Secondary | ICD-10-CM

## 2012-03-02 DIAGNOSIS — J4489 Other specified chronic obstructive pulmonary disease: Secondary | ICD-10-CM

## 2012-03-02 LAB — BASIC METABOLIC PANEL
BUN: 21 mg/dL (ref 6–23)
CO2: 30 mEq/L (ref 19–32)
GFR: 97.28 mL/min (ref >60.00)
Glucose, Bld: 90 mg/dL (ref 70–99)
Potassium: 5.1 mEq/L (ref 3.5–5.1)

## 2012-03-02 LAB — CBC WITH DIFFERENTIAL/PLATELET
Basophils Absolute: 0 10*3/uL (ref 0.0–0.1)
Eosinophils Relative: 7.7 % — ABNORMAL HIGH (ref 0.0–5.0)
HCT: 42.5 % (ref 39.0–52.0)
Lymphocytes Relative: 26.1 % (ref 12.0–46.0)
Monocytes Relative: 10 % (ref 3.0–12.0)
Neutrophils Relative %: 55.6 % (ref 43.0–77.0)
Platelets: 216 10*3/uL (ref 150.0–400.0)
WBC: 7.3 10*3/uL (ref 4.5–10.5)

## 2012-03-02 LAB — PSA: PSA: 1.28 ng/mL (ref 0.10–4.00)

## 2012-03-02 LAB — HEPATIC FUNCTION PANEL
ALT: 17 U/L (ref 0–53)
AST: 22 U/L (ref 0–37)
Albumin: 4.3 g/dL (ref 3.5–5.2)
Total Protein: 7.2 g/dL (ref 6.0–8.3)

## 2012-03-02 LAB — TSH: TSH: 0.85 u[IU]/mL (ref 0.35–5.50)

## 2012-03-02 LAB — LIPID PANEL
Cholesterol: 151 mg/dL (ref 0–200)
HDL: 49.4 mg/dL (ref >39.00)
VLDL: 22.8 mg/dL (ref 0.0–40.0)

## 2012-03-02 MED ORDER — ALPRAZOLAM 0.5 MG PO TABS: ORAL_TABLET | ORAL | Status: AC

## 2012-03-02 MED ORDER — TEMAZEPAM 30 MG PO CAPS: 30.0000 mg | ORAL_CAPSULE | Freq: Every evening | ORAL | Status: DC | PRN

## 2012-03-02 MED ORDER — ATORVASTATIN CALCIUM 10 MG PO TABS: 10.0000 mg | ORAL_TABLET | Freq: Every day | ORAL | Status: DC

## 2012-03-02 MED ORDER — TIOTROPIUM BROMIDE MONOHYDRATE 18 MCG IN CAPS: 18.0000 ug | ORAL_CAPSULE | Freq: Every day | RESPIRATORY_TRACT | Status: DC

## 2012-03-02 MED ORDER — NABUMETONE 750 MG PO TABS: 750.0000 mg | ORAL_TABLET | Freq: Two times a day (BID) | ORAL | Status: DC

## 2012-03-02 MED ORDER — METOPROLOL SUCCINATE ER 50 MG PO TB24: 50.0000 mg | ORAL_TABLET | Freq: Every day | ORAL | Status: DC
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
## 2012-03-02 NOTE — Patient Instructions (Addendum)
Today we updated your med list in our EPIC system...    Continue your current medications the same...    We refilled your meds per request...  Today we did your f/u CXR & FASTING blood work...    We will call you w/ the results when avail...  Call for any problems...  Continue your great exercise program!  Let's continue our yearly check ups, but call anytime if needed for problems.Marland KitchenMarland Kitchen

## 2012-03-04 ENCOUNTER — Encounter: Payer: Self-pay | Admitting: Pulmonary Disease

## 2012-04-11 DIAGNOSIS — H40019 Open angle with borderline findings, low risk, unspecified eye: Secondary | ICD-10-CM | POA: Diagnosis not present

## 2012-04-11 DIAGNOSIS — H40059 Ocular hypertension, unspecified eye: Secondary | ICD-10-CM | POA: Diagnosis not present

## 2012-04-13 DIAGNOSIS — Z23 Encounter for immunization: Secondary | ICD-10-CM | POA: Diagnosis not present

## 2012-08-31 ENCOUNTER — Other Ambulatory Visit: Payer: Self-pay | Admitting: Pulmonary Disease

## 2012-09-04 ENCOUNTER — Telehealth: Payer: Self-pay | Admitting: Pulmonary Disease

## 2012-09-04 NOTE — Telephone Encounter (Signed)
Temazepam called to the pharmacy and the pt is aware.

## 2012-10-10 DIAGNOSIS — H52209 Unspecified astigmatism, unspecified eye: Secondary | ICD-10-CM | POA: Diagnosis not present

## 2012-10-10 DIAGNOSIS — H40019 Open angle with borderline findings, low risk, unspecified eye: Secondary | ICD-10-CM | POA: Diagnosis not present

## 2012-10-10 DIAGNOSIS — D2310 Other benign neoplasm of skin of unspecified eyelid, including canthus: Secondary | ICD-10-CM | POA: Diagnosis not present

## 2012-10-10 DIAGNOSIS — H2510 Age-related nuclear cataract, unspecified eye: Secondary | ICD-10-CM | POA: Diagnosis not present

## 2012-10-16 ENCOUNTER — Other Ambulatory Visit: Payer: Self-pay | Admitting: Ophthalmology

## 2012-10-16 DIAGNOSIS — D2310 Other benign neoplasm of skin of unspecified eyelid, including canthus: Secondary | ICD-10-CM | POA: Diagnosis not present

## 2012-10-16 DIAGNOSIS — L820 Inflamed seborrheic keratosis: Secondary | ICD-10-CM | POA: Diagnosis not present

## 2013-03-05 ENCOUNTER — Other Ambulatory Visit (INDEPENDENT_AMBULATORY_CARE_PROVIDER_SITE_OTHER): Payer: Medicare Other

## 2013-03-05 ENCOUNTER — Encounter: Payer: Self-pay | Admitting: Pulmonary Disease

## 2013-03-05 ENCOUNTER — Ambulatory Visit (INDEPENDENT_AMBULATORY_CARE_PROVIDER_SITE_OTHER)
Admission: RE | Admit: 2013-03-05 | Discharge: 2013-03-05 | Disposition: A | Discharge status: Home / Self Care | Payer: Medicare Other | Source: Ambulatory Visit | Attending: Pulmonary Disease | Admitting: Pulmonary Disease

## 2013-03-05 ENCOUNTER — Ambulatory Visit (INDEPENDENT_AMBULATORY_CARE_PROVIDER_SITE_OTHER): Payer: Medicare Other | Admitting: Pulmonary Disease

## 2013-03-05 VITALS — BP 140/86 | HR 64 | Temp 97.7°F | Ht 72.0 in | Wt 178.8 lb

## 2013-03-05 DIAGNOSIS — Z87442 Personal history of urinary calculi: Secondary | ICD-10-CM

## 2013-03-05 DIAGNOSIS — M1990 Unspecified osteoarthritis, unspecified site: Secondary | ICD-10-CM

## 2013-03-05 DIAGNOSIS — N320 Bladder-neck obstruction: Secondary | ICD-10-CM | POA: Diagnosis not present

## 2013-03-05 DIAGNOSIS — J449 Chronic obstructive pulmonary disease, unspecified: Secondary | ICD-10-CM | POA: Diagnosis not present

## 2013-03-05 DIAGNOSIS — I2510 Atherosclerotic heart disease of native coronary artery without angina pectoris: Secondary | ICD-10-CM | POA: Diagnosis not present

## 2013-03-05 DIAGNOSIS — I8390 Asymptomatic varicose veins of unspecified lower extremity: Secondary | ICD-10-CM | POA: Diagnosis not present

## 2013-03-05 DIAGNOSIS — I10 Essential (primary) hypertension: Secondary | ICD-10-CM | POA: Diagnosis not present

## 2013-03-05 DIAGNOSIS — E7800 Pure hypercholesterolemia, unspecified: Secondary | ICD-10-CM

## 2013-03-05 DIAGNOSIS — K5730 Diverticulosis of large intestine without perforation or abscess without bleeding: Secondary | ICD-10-CM

## 2013-03-05 DIAGNOSIS — J4489 Other specified chronic obstructive pulmonary disease: Secondary | ICD-10-CM

## 2013-03-05 DIAGNOSIS — D126 Benign neoplasm of colon, unspecified: Secondary | ICD-10-CM

## 2013-03-05 DIAGNOSIS — I8391 Asymptomatic varicose veins of right lower extremity: Secondary | ICD-10-CM

## 2013-03-05 DIAGNOSIS — F411 Generalized anxiety disorder: Secondary | ICD-10-CM

## 2013-03-05 LAB — BASIC METABOLIC PANEL
CO2: 28 mEq/L (ref 19–32)
Calcium: 9.1 mg/dL (ref 8.4–10.5)
Chloride: 104 mEq/L (ref 96–112)
Creatinine, Ser: 1.1 mg/dL (ref 0.4–1.5)
Glucose, Bld: 89 mg/dL (ref 70–99)

## 2013-03-05 LAB — CBC WITH DIFFERENTIAL/PLATELET
Basophils Absolute: 0 10*3/uL (ref 0.0–0.1)
Basophils Relative: 0.5 % (ref 0.0–3.0)
Eosinophils Absolute: 0.5 10*3/uL (ref 0.0–0.7)
Hemoglobin: 14.5 g/dL (ref 13.0–17.0)
Lymphocytes Relative: 23.3 % (ref 12.0–46.0)
MCHC: 34.6 g/dL (ref 30.0–36.0)
Monocytes Relative: 9.7 % (ref 3.0–12.0)
Neutro Abs: 4.5 10*3/uL (ref 1.4–7.7)
Neutrophils Relative %: 59.7 % (ref 43.0–77.0)
RBC: 4.51 Mil/uL (ref 4.22–5.81)
RDW: 13 % (ref 11.5–14.6)

## 2013-03-05 LAB — LIPID PANEL
Cholesterol: 142 mg/dL (ref 0–200)
HDL: 42.3 mg/dL (ref >39.00)
Triglycerides: 85 mg/dL (ref 0.0–149.0)

## 2013-03-05 LAB — PSA: PSA: 1.32 ng/mL (ref 0.10–4.00)

## 2013-03-05 LAB — HEPATIC FUNCTION PANEL
ALT: 16 U/L (ref 0–53)
Albumin: 4.3 g/dL (ref 3.5–5.2)
Total Protein: 7.5 g/dL (ref 6.0–8.3)

## 2013-03-05 MED ORDER — METOPROLOL SUCCINATE ER 50 MG PO TB24: 50.0000 mg | ORAL_TABLET | Freq: Every day | ORAL | Status: DC

## 2013-03-05 MED ORDER — TEMAZEPAM 30 MG PO CAPS: ORAL_CAPSULE | ORAL | Status: DC

## 2013-03-05 MED ORDER — TIOTROPIUM BROMIDE MONOHYDRATE 18 MCG IN CAPS: 18.0000 ug | ORAL_CAPSULE | Freq: Every day | RESPIRATORY_TRACT | Status: DC

## 2013-03-05 MED ORDER — ATORVASTATIN CALCIUM 10 MG PO TABS: 10.0000 mg | ORAL_TABLET | ORAL | Status: DC

## 2013-03-05 MED ORDER — ALPRAZOLAM 0.5 MG PO TABS: ORAL_TABLET | ORAL | Status: DC

## 2013-03-05 MED ORDER — NABUMETONE 750 MG PO TABS: 750.0000 mg | ORAL_TABLET | Freq: Two times a day (BID) | ORAL | Status: DC

## 2013-03-05 MED ORDER — PANTOPRAZOLE SODIUM 40 MG PO TBEC: 40.0000 mg | DELAYED_RELEASE_TABLET | Freq: Every day | ORAL | Status: DC
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
## 2013-03-05 NOTE — Patient Instructions (Addendum)
Today we updated your med list in our EPIC system...    Continue your current medications the same...  We wrote a new prescription for PANTOPRAZOLE 40mg  tabs take one tab daily ~110min before a meal for your hyperacidity.Marland Kitchen.    (you can get #90 from [facility] for $20)...  Today we did your follow up CXR, EKG, & FASTING blood work...    We will contact you w/ the results when available...   Keep up the good work w/ diet & exercise...  Call for any questions...  Let's plan a follow up visit in 82yr, sooner if needed for problems.Marland KitchenMarland Kitchen

## 2013-04-30 DIAGNOSIS — Z23 Encounter for immunization: Secondary | ICD-10-CM | POA: Diagnosis not present

## 2013-05-11 DIAGNOSIS — H40059 Ocular hypertension, unspecified eye: Secondary | ICD-10-CM | POA: Diagnosis not present

## 2013-05-11 DIAGNOSIS — H40019 Open angle with borderline findings, low risk, unspecified eye: Secondary | ICD-10-CM | POA: Diagnosis not present

## 2013-08-31 ENCOUNTER — Other Ambulatory Visit: Payer: Self-pay | Admitting: Pulmonary Disease

## 2013-09-05 ENCOUNTER — Telehealth: Payer: Self-pay | Admitting: Pulmonary Disease

## 2013-09-05 MED ORDER — TEMAZEPAM 30 MG PO CAPS: ORAL_CAPSULE | ORAL | Status: DC

## 2013-09-05 NOTE — Telephone Encounter (Signed)
Rx has been called in. Pt is aware.

## 2013-10-02 ENCOUNTER — Other Ambulatory Visit: Payer: Self-pay | Admitting: Pulmonary Disease

## 2013-10-04 ENCOUNTER — Telehealth: Payer: Self-pay | Admitting: Pulmonary Disease

## 2013-10-04 NOTE — Telephone Encounter (Signed)
lmtcb x1 for pt Need to make pt aware SN retiring from [facility] 10/17/13. Need to set up with new PCP. Has pending appt in sept. Can still be seen for COPD.

## 2013-10-04 NOTE — Telephone Encounter (Signed)
Pt calling in regards to left msg from our office . Mindy contact patient about xanax rx refill that we received Explained to the patient that Dr Lenna Gilford is retiring from primary care as of April 1, will only do Pulmonary in office thereafter. Appt scheduled with Lenna Gilford 03/06/14 for ROV 1 yr--explained to the patient that we could keep this appt scheduled but for pulmonary reasons only--COPD and 1 yr cxr f/u. Pt expressed understanding Will refill Xanax x 51mo until patient can get established with new PCP. Patient has been given numbers to 3 locations to contact and try to establish PC-- [place], [place] and [place]. Pt will contact our office if anything further needed.

## 2013-10-09 DIAGNOSIS — H3530 Unspecified macular degeneration: Secondary | ICD-10-CM | POA: Diagnosis not present

## 2013-10-09 DIAGNOSIS — H40059 Ocular hypertension, unspecified eye: Secondary | ICD-10-CM | POA: Diagnosis not present

## 2013-10-09 DIAGNOSIS — H2510 Age-related nuclear cataract, unspecified eye: Secondary | ICD-10-CM | POA: Diagnosis not present

## 2013-10-09 DIAGNOSIS — H40019 Open angle with borderline findings, low risk, unspecified eye: Secondary | ICD-10-CM | POA: Diagnosis not present

## 2014-03-06 ENCOUNTER — Ambulatory Visit (INDEPENDENT_AMBULATORY_CARE_PROVIDER_SITE_OTHER): Payer: Medicare Other | Admitting: Pulmonary Disease

## 2014-03-06 ENCOUNTER — Ambulatory Visit (INDEPENDENT_AMBULATORY_CARE_PROVIDER_SITE_OTHER)
Admission: RE | Admit: 2014-03-06 | Discharge: 2014-03-06 | Disposition: A | Discharge status: Home / Self Care | Payer: Medicare Other | Source: Ambulatory Visit | Attending: Pulmonary Disease | Admitting: Pulmonary Disease

## 2014-03-06 ENCOUNTER — Telehealth: Payer: Self-pay | Admitting: Pulmonary Disease

## 2014-03-06 ENCOUNTER — Other Ambulatory Visit (INDEPENDENT_AMBULATORY_CARE_PROVIDER_SITE_OTHER): Payer: Medicare Other

## 2014-03-06 ENCOUNTER — Encounter: Payer: Self-pay | Admitting: Pulmonary Disease

## 2014-03-06 VITALS — BP 146/80 | HR 66 | Temp 97.5°F | Ht 70.0 in | Wt 179.8 lb

## 2014-03-06 DIAGNOSIS — J4489 Other specified chronic obstructive pulmonary disease: Secondary | ICD-10-CM

## 2014-03-06 DIAGNOSIS — Z23 Encounter for immunization: Secondary | ICD-10-CM | POA: Diagnosis not present

## 2014-03-06 DIAGNOSIS — Z87891 Personal history of nicotine dependence: Secondary | ICD-10-CM | POA: Diagnosis not present

## 2014-03-06 DIAGNOSIS — J449 Chronic obstructive pulmonary disease, unspecified: Secondary | ICD-10-CM | POA: Diagnosis not present

## 2014-03-06 DIAGNOSIS — N320 Bladder-neck obstruction: Secondary | ICD-10-CM

## 2014-03-06 DIAGNOSIS — F411 Generalized anxiety disorder: Secondary | ICD-10-CM

## 2014-03-06 DIAGNOSIS — D126 Benign neoplasm of colon, unspecified: Secondary | ICD-10-CM

## 2014-03-06 DIAGNOSIS — I10 Essential (primary) hypertension: Secondary | ICD-10-CM | POA: Diagnosis not present

## 2014-03-06 DIAGNOSIS — Z87442 Personal history of urinary calculi: Secondary | ICD-10-CM

## 2014-03-06 DIAGNOSIS — E7800 Pure hypercholesterolemia, unspecified: Secondary | ICD-10-CM

## 2014-03-06 DIAGNOSIS — Z0000 Encounter for general adult medical examination without abnormal findings: Secondary | ICD-10-CM | POA: Diagnosis not present

## 2014-03-06 DIAGNOSIS — R490 Dysphonia: Secondary | ICD-10-CM | POA: Diagnosis not present

## 2014-03-06 DIAGNOSIS — I2510 Atherosclerotic heart disease of native coronary artery without angina pectoris: Secondary | ICD-10-CM | POA: Diagnosis not present

## 2014-03-06 DIAGNOSIS — K5730 Diverticulosis of large intestine without perforation or abscess without bleeding: Secondary | ICD-10-CM

## 2014-03-06 DIAGNOSIS — I8390 Asymptomatic varicose veins of unspecified lower extremity: Secondary | ICD-10-CM

## 2014-03-06 DIAGNOSIS — M1990 Unspecified osteoarthritis, unspecified site: Secondary | ICD-10-CM

## 2014-03-06 DIAGNOSIS — I8393 Asymptomatic varicose veins of bilateral lower extremities: Secondary | ICD-10-CM

## 2014-03-06 LAB — CBC WITH DIFFERENTIAL/PLATELET
BASOS ABS: 0 10*3/uL (ref 0.0–0.1)
Basophils Relative: 0.6 % (ref 0.0–3.0)
EOS ABS: 0.5 10*3/uL (ref 0.0–0.7)
Eosinophils Relative: 6.1 % — ABNORMAL HIGH (ref 0.0–5.0)
HCT: 44.3 % (ref 39.0–52.0)
Hemoglobin: 15.2 g/dL (ref 13.0–17.0)
LYMPHS PCT: 24.7 % (ref 12.0–46.0)
Lymphs Abs: 1.9 10*3/uL (ref 0.7–4.0)
MCHC: 34.3 g/dL (ref 30.0–36.0)
MCV: 92.8 fl (ref 78.0–100.0)
MONO ABS: 0.7 10*3/uL (ref 0.1–1.0)
Monocytes Relative: 8.8 % (ref 3.0–12.0)
NEUTROS ABS: 4.7 10*3/uL (ref 1.4–7.7)
Neutrophils Relative %: 59.8 % (ref 43.0–77.0)
Platelets: 199 10*3/uL (ref 150.0–400.0)
RBC: 4.78 Mil/uL (ref 4.22–5.81)
RDW: 13.4 % (ref 11.5–15.5)
WBC: 7.8 10*3/uL (ref 4.0–10.5)

## 2014-03-06 LAB — BASIC METABOLIC PANEL
BUN: 22 mg/dL (ref 6–23)
CALCIUM: 9.4 mg/dL (ref 8.4–10.5)
CO2: 28 mEq/L (ref 19–32)
CREATININE: 1.1 mg/dL (ref 0.4–1.5)
Chloride: 104 mEq/L (ref 96–112)
GFR: 71.38 mL/min (ref >60.00)
Glucose, Bld: 91 mg/dL (ref 70–99)
Potassium: 4.9 mEq/L (ref 3.5–5.1)
Sodium: 139 mEq/L (ref 135–145)

## 2014-03-06 LAB — LIPID PANEL
CHOL/HDL RATIO: 3
Cholesterol: 182 mg/dL (ref 0–200)
HDL: 58 mg/dL (ref >39.00)
LDL CALC: 106 mg/dL — AB (ref 0–99)
NONHDL: 124
Triglycerides: 89 mg/dL (ref 0.0–149.0)
VLDL: 17.8 mg/dL (ref 0.0–40.0)

## 2014-03-06 LAB — HEPATIC FUNCTION PANEL
ALT: 19 U/L (ref 0–53)
AST: 25 U/L (ref 0–37)
Albumin: 4.5 g/dL (ref 3.5–5.2)
Alkaline Phosphatase: 82 U/L (ref 39–117)
BILIRUBIN DIRECT: 0.2 mg/dL (ref 0.0–0.3)
BILIRUBIN TOTAL: 1.5 mg/dL — AB (ref 0.2–1.2)
TOTAL PROTEIN: 7.9 g/dL (ref 6.0–8.3)

## 2014-03-06 LAB — PSA: PSA: 1.43 ng/mL (ref 0.10–4.00)

## 2014-03-06 LAB — TSH: TSH: 0.8 u[IU]/mL (ref 0.35–4.50)

## 2014-03-06 MED ORDER — TEMAZEPAM 30 MG PO CAPS: ORAL_CAPSULE | ORAL | Status: DC

## 2014-03-06 MED ORDER — NABUMETONE 750 MG PO TABS: 750.0000 mg | ORAL_TABLET | Freq: Two times a day (BID) | ORAL | Status: DC

## 2014-03-06 MED ORDER — TIOTROPIUM BROMIDE MONOHYDRATE 18 MCG IN CAPS: 18.0000 ug | ORAL_CAPSULE | Freq: Every day | RESPIRATORY_TRACT | Status: DC

## 2014-03-06 MED ORDER — PANTOPRAZOLE SODIUM 40 MG PO TBEC: 40.0000 mg | DELAYED_RELEASE_TABLET | Freq: Every day | ORAL | Status: DC

## 2014-03-06 MED ORDER — METOPROLOL SUCCINATE ER 50 MG PO TB24: 50.0000 mg | ORAL_TABLET | Freq: Every day | ORAL | Status: DC

## 2014-03-06 MED ORDER — ATORVASTATIN CALCIUM 10 MG PO TABS: 10.0000 mg | ORAL_TABLET | ORAL | Status: DC

## 2014-03-06 MED ORDER — ALPRAZOLAM 0.5 MG PO TABS: ORAL_TABLET | ORAL | Status: DC
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
## 2014-03-06 NOTE — Patient Instructions (Signed)
Today we updated your med list in our EPIC system...    Continue your current medications the same...    We refilled your meds per request...  Today we did your follow up CXR & FASTING blood work...    We will contact you w/ the results when available...   Keep up the good work w/ diet & exercise...  Today we gave you the one time PREVNAR-13 pneumonia vaccine and the 10 yr TDAP (Tetanus) shot  Call for any questions...  Let's plan a follow up visit in 62yr, sooner if needed for problems.Marland KitchenMarland Kitchen

## 2014-03-06 NOTE — Telephone Encounter (Signed)
These rx have been called to the pharmacy and the pt is aware. Nothing further is needed

## 2014-03-21 DIAGNOSIS — K219 Gastro-esophageal reflux disease without esophagitis: Secondary | ICD-10-CM | POA: Diagnosis not present

## 2014-03-21 DIAGNOSIS — H9209 Otalgia, unspecified ear: Secondary | ICD-10-CM | POA: Diagnosis not present

## 2014-03-21 DIAGNOSIS — J387 Other diseases of larynx: Secondary | ICD-10-CM | POA: Diagnosis not present

## 2014-04-09 DIAGNOSIS — H3530 Unspecified macular degeneration: Secondary | ICD-10-CM | POA: Diagnosis not present

## 2014-04-09 DIAGNOSIS — H40019 Open angle with borderline findings, low risk, unspecified eye: Secondary | ICD-10-CM | POA: Diagnosis not present

## 2014-04-09 DIAGNOSIS — H40059 Ocular hypertension, unspecified eye: Secondary | ICD-10-CM | POA: Diagnosis not present

## 2014-04-17 ENCOUNTER — Telehealth: Payer: Self-pay | Admitting: Pulmonary Disease

## 2014-04-17 MED ORDER — ATORVASTATIN CALCIUM 20 MG PO TABS: ORAL_TABLET | ORAL | Status: DC

## 2014-04-17 NOTE — Telephone Encounter (Signed)
PA was done for pts atorvastatin 10 mg.  This has been denied and they advised that they will cover the atorvastatin 20 mg.  Per SN--  Change to the atorvastatin 20 mg   1/2 tablet daily.  This has been sent to the pts pharmacy.  Nothing further is needed.

## 2014-04-18 ENCOUNTER — Telehealth: Payer: Self-pay | Admitting: Pulmonary Disease

## 2014-04-18 NOTE — Telephone Encounter (Signed)
Spoke with pt - He states that he has always been on Lipitor 10mg  every other day and was given an rx for this at last ov for #15 tablets.  Per labs that is the dose that he soul be on.  [facility] called pt last night and told him that have an rx for Lipitor 20 mg every other day.  Pt was unaware of any changes in his meds.  Please advise on dose

## 2014-04-18 NOTE — Telephone Encounter (Signed)
Called and spoke with pt and advised him that his insurance would not cover the atorvastatin 10 mg  But would cover the 20 mg  Tablets.  He is aware this is why that rx was sent to his pharmacy.  Pt is aware to cont with this rx.  Nothing further is needed.

## 2014-04-19 ENCOUNTER — Ambulatory Visit (INDEPENDENT_AMBULATORY_CARE_PROVIDER_SITE_OTHER): Payer: Medicare Other

## 2014-04-19 DIAGNOSIS — Z23 Encounter for immunization: Secondary | ICD-10-CM | POA: Diagnosis not present

## 2014-08-21 ENCOUNTER — Other Ambulatory Visit: Payer: Self-pay | Admitting: Pulmonary Disease

## 2014-08-28 ENCOUNTER — Other Ambulatory Visit: Payer: Self-pay | Admitting: Pulmonary Disease

## 2014-09-05 ENCOUNTER — Telehealth: Payer: Self-pay | Admitting: Pulmonary Disease

## 2014-09-05 MED ORDER — TEMAZEPAM 30 MG PO CAPS: ORAL_CAPSULE | ORAL | Status: DC

## 2014-09-05 NOTE — Telephone Encounter (Signed)
Spoke with pt and advised that refills for Temazepam was called to pharmacy.

## 2014-10-15 DIAGNOSIS — H2513 Age-related nuclear cataract, bilateral: Secondary | ICD-10-CM | POA: Diagnosis not present

## 2014-10-15 DIAGNOSIS — H52203 Unspecified astigmatism, bilateral: Secondary | ICD-10-CM | POA: Diagnosis not present

## 2014-10-15 DIAGNOSIS — H3531 Nonexudative age-related macular degeneration: Secondary | ICD-10-CM | POA: Diagnosis not present

## 2014-10-15 DIAGNOSIS — H349 Unspecified retinal vascular occlusion: Secondary | ICD-10-CM | POA: Diagnosis not present

## 2014-10-31 DIAGNOSIS — H35033 Hypertensive retinopathy, bilateral: Secondary | ICD-10-CM | POA: Diagnosis not present

## 2014-10-31 DIAGNOSIS — H3531 Nonexudative age-related macular degeneration: Secondary | ICD-10-CM | POA: Diagnosis not present

## 2014-10-31 DIAGNOSIS — H34831 Tributary (branch) retinal vein occlusion, right eye: Secondary | ICD-10-CM | POA: Diagnosis not present

## 2014-12-17 DIAGNOSIS — H34831 Tributary (branch) retinal vein occlusion, right eye: Secondary | ICD-10-CM | POA: Diagnosis not present

## 2014-12-17 DIAGNOSIS — H3531 Nonexudative age-related macular degeneration: Secondary | ICD-10-CM | POA: Diagnosis not present

## 2015-01-28 DIAGNOSIS — H34831 Tributary (branch) retinal vein occlusion, right eye: Secondary | ICD-10-CM | POA: Diagnosis not present

## 2015-03-03 ENCOUNTER — Other Ambulatory Visit: Payer: Self-pay | Admitting: Pulmonary Disease

## 2015-03-04 ENCOUNTER — Telehealth: Payer: Self-pay | Admitting: Pulmonary Disease

## 2015-03-04 MED ORDER — TEMAZEPAM 30 MG PO CAPS: ORAL_CAPSULE | ORAL | Status: DC

## 2015-03-04 NOTE — Telephone Encounter (Signed)
Called RX into the pharm for restoril, Spouse is aware and nothing further needed

## 2015-03-07 ENCOUNTER — Ambulatory Visit: Payer: Medicare Other | Admitting: Pulmonary Disease

## 2015-03-11 ENCOUNTER — Encounter: Payer: Self-pay | Admitting: Pulmonary Disease

## 2015-03-11 ENCOUNTER — Other Ambulatory Visit (INDEPENDENT_AMBULATORY_CARE_PROVIDER_SITE_OTHER): Payer: Medicare Other

## 2015-03-11 ENCOUNTER — Telehealth: Payer: Self-pay | Admitting: Pulmonary Disease

## 2015-03-11 ENCOUNTER — Ambulatory Visit (INDEPENDENT_AMBULATORY_CARE_PROVIDER_SITE_OTHER)
Admission: RE | Admit: 2015-03-11 | Discharge: 2015-03-11 | Disposition: A | Discharge status: Home / Self Care | Payer: Medicare Other | Source: Ambulatory Visit | Attending: Pulmonary Disease | Admitting: Pulmonary Disease

## 2015-03-11 ENCOUNTER — Ambulatory Visit (INDEPENDENT_AMBULATORY_CARE_PROVIDER_SITE_OTHER): Payer: Medicare Other | Admitting: Pulmonary Disease

## 2015-03-11 VITALS — BP 134/80 | HR 76 | Temp 97.2°F | Wt 175.8 lb

## 2015-03-11 DIAGNOSIS — I2510 Atherosclerotic heart disease of native coronary artery without angina pectoris: Secondary | ICD-10-CM | POA: Diagnosis not present

## 2015-03-11 DIAGNOSIS — J449 Chronic obstructive pulmonary disease, unspecified: Secondary | ICD-10-CM

## 2015-03-11 DIAGNOSIS — Z87442 Personal history of urinary calculi: Secondary | ICD-10-CM

## 2015-03-11 DIAGNOSIS — E7800 Pure hypercholesterolemia, unspecified: Secondary | ICD-10-CM

## 2015-03-11 DIAGNOSIS — M159 Polyosteoarthritis, unspecified: Secondary | ICD-10-CM

## 2015-03-11 DIAGNOSIS — N138 Other obstructive and reflux uropathy: Secondary | ICD-10-CM

## 2015-03-11 DIAGNOSIS — M150 Primary generalized (osteo)arthritis: Secondary | ICD-10-CM

## 2015-03-11 DIAGNOSIS — I10 Essential (primary) hypertension: Secondary | ICD-10-CM | POA: Diagnosis not present

## 2015-03-11 DIAGNOSIS — F411 Generalized anxiety disorder: Secondary | ICD-10-CM

## 2015-03-11 DIAGNOSIS — Z87891 Personal history of nicotine dependence: Secondary | ICD-10-CM | POA: Diagnosis not present

## 2015-03-11 DIAGNOSIS — I8393 Asymptomatic varicose veins of bilateral lower extremities: Secondary | ICD-10-CM

## 2015-03-11 DIAGNOSIS — N401 Enlarged prostate with lower urinary tract symptoms: Secondary | ICD-10-CM | POA: Diagnosis not present

## 2015-03-11 DIAGNOSIS — D126 Benign neoplasm of colon, unspecified: Secondary | ICD-10-CM

## 2015-03-11 DIAGNOSIS — K5730 Diverticulosis of large intestine without perforation or abscess without bleeding: Secondary | ICD-10-CM

## 2015-03-11 LAB — CBC WITH DIFFERENTIAL/PLATELET
Basophils Absolute: 0 K/uL (ref 0.0–0.1)
Basophils Relative: 0.6 % (ref 0.0–3.0)
Eosinophils Absolute: 0.4 K/uL (ref 0.0–0.7)
Eosinophils Relative: 5.8 % — ABNORMAL HIGH (ref 0.0–5.0)
HCT: 43.4 % (ref 39.0–52.0)
Hemoglobin: 14.9 g/dL (ref 13.0–17.0)
Lymphocytes Relative: 26.3 % (ref 12.0–46.0)
Lymphs Abs: 1.8 K/uL (ref 0.7–4.0)
MCHC: 34.2 g/dL (ref 30.0–36.0)
MCV: 93.3 fl (ref 78.0–100.0)
Monocytes Absolute: 0.6 K/uL (ref 0.1–1.0)
Monocytes Relative: 8.8 % (ref 3.0–12.0)
Neutro Abs: 4.1 K/uL (ref 1.4–7.7)
Neutrophils Relative %: 58.5 % (ref 43.0–77.0)
Platelets: 192 K/uL (ref 150.0–400.0)
RBC: 4.65 Mil/uL (ref 4.22–5.81)
RDW: 13.1 % (ref 11.5–15.5)
WBC: 7 K/uL (ref 4.0–10.5)

## 2015-03-11 LAB — LIPID PANEL
CHOL/HDL RATIO: 4
Cholesterol: 168 mg/dL (ref 0–200)
HDL: 47.2 mg/dL (ref >39.00)
LDL CALC: 100 mg/dL — AB (ref 0–99)
NONHDL: 120.99
TRIGLYCERIDES: 107 mg/dL (ref 0.0–149.0)
VLDL: 21.4 mg/dL (ref 0.0–40.0)

## 2015-03-11 LAB — URINALYSIS, ROUTINE W REFLEX MICROSCOPIC
Bilirubin Urine: NEGATIVE
Hgb urine dipstick: NEGATIVE
Ketones, ur: NEGATIVE
Nitrite: NEGATIVE
Specific Gravity, Urine: 1.02
Total Protein, Urine: NEGATIVE
Urine Glucose: NEGATIVE
Urobilinogen, UA: 0.2
pH: 6 (ref 5.0–8.0)

## 2015-03-11 LAB — TSH: TSH: 0.76 u[IU]/mL (ref 0.35–4.50)

## 2015-03-11 LAB — BASIC METABOLIC PANEL
BUN: 20 mg/dL (ref 6–23)
CALCIUM: 9.4 mg/dL (ref 8.4–10.5)
CO2: 32 mEq/L (ref 19–32)
CREATININE: 1.07 mg/dL (ref 0.40–1.50)
Chloride: 102 mEq/L (ref 96–112)
GFR: 71.95 mL/min (ref >60.00)
Glucose, Bld: 89 mg/dL (ref 70–99)
Potassium: 4.8 mEq/L (ref 3.5–5.1)
Sodium: 139 mEq/L (ref 135–145)

## 2015-03-11 LAB — HEPATIC FUNCTION PANEL
ALT: 13 U/L (ref 0–53)
AST: 18 U/L (ref 0–37)
Albumin: 4.5 g/dL (ref 3.5–5.2)
Alkaline Phosphatase: 78 U/L (ref 39–117)
BILIRUBIN DIRECT: 0.2 mg/dL (ref 0.0–0.3)
BILIRUBIN TOTAL: 1.1 mg/dL (ref 0.2–1.2)
Total Protein: 7.3 g/dL (ref 6.0–8.3)

## 2015-03-11 LAB — PSA: PSA: 1.35 ng/mL (ref 0.10–4.00)

## 2015-03-11 MED ORDER — ATORVASTATIN CALCIUM 20 MG PO TABS: ORAL_TABLET | ORAL | Status: DC

## 2015-03-11 MED ORDER — TIOTROPIUM BROMIDE MONOHYDRATE 18 MCG IN CAPS: 18.0000 ug | ORAL_CAPSULE | Freq: Every day | RESPIRATORY_TRACT | Status: DC

## 2015-03-11 MED ORDER — OMEPRAZOLE 40 MG PO CPDR: 40.0000 mg | DELAYED_RELEASE_CAPSULE | Freq: Every day | ORAL | Status: DC

## 2015-03-11 MED ORDER — NABUMETONE 750 MG PO TABS: 750.0000 mg | ORAL_TABLET | Freq: Two times a day (BID) | ORAL | Status: DC

## 2015-03-11 MED ORDER — METOPROLOL SUCCINATE ER 50 MG PO TB24: 50.0000 mg | ORAL_TABLET | Freq: Every day | ORAL | Status: DC

## 2015-03-11 NOTE — Patient Instructions (Signed)
Today we updated your med list in our EPIC system...    Continue your current medications the same...    We refilled your meds per request...  Today we did your follow up CXR & FASTING blood work & Urine...    We will contact you w/ the results when available...   We wrote a new prescription for OMEPRAZOLE 20mg  tabs to take one tab daily as needed for stomach acid...  We wrote a new Rx for ZOVIRAX ointment to apply as needed...  Call for any questions...  Let's plan a follow up visit in 40yr, sooner if needed for problems.Marland KitchenMarland Kitchen

## 2015-03-11 NOTE — Telephone Encounter (Signed)
Patient states that he saw Dr. Lenna Gilford today and was suppose to get Rx for Alprazolam and Zovirax.   These prescriptions were not sent to pharmacy.  Dr. Lenna Gilford - please advise dosing for Zovirax and if ok to refill Alprazolam
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
## 2015-03-12 MED ORDER — ACYCLOVIR 5 % EX OINT: TOPICAL_OINTMENT | CUTANEOUS | Status: DC

## 2015-03-12 MED ORDER — ALPRAZOLAM 0.5 MG PO TABS: ORAL_TABLET | ORAL | Status: DC

## 2015-03-12 NOTE — Telephone Encounter (Signed)
Please respond to patient. They need to go pick these up today [phone]

## 2015-03-12 NOTE — Telephone Encounter (Signed)
Per SN,   Ok to refill xanax Send script for Zovirax 5% ointment #1 tube with instructions to use as directed to pharm. PRN refills

## 2015-03-12 NOTE — Telephone Encounter (Signed)
Jerome Craig - has this been taken care of? Please advise.

## 2015-03-12 NOTE — Telephone Encounter (Signed)
Called and spoke to pt's wife. Informed her of the refills. Both rxs sent to preferred pharmacy. Pt verbalized understanding and denied any further questions or concerns at this time.

## 2015-03-13 ENCOUNTER — Telehealth: Payer: Self-pay | Admitting: Pulmonary Disease

## 2015-03-13 MED ORDER — OMEPRAZOLE 20 MG PO CPDR: 20.0000 mg | DELAYED_RELEASE_CAPSULE | Freq: Every day | ORAL | Status: DC

## 2015-03-13 NOTE — Telephone Encounter (Signed)
Called spoke with spouse. Apologized wrong RX sent in and corrected RX sent in. Nothing further needed

## 2015-03-20 ENCOUNTER — Telehealth: Payer: Self-pay | Admitting: *Deleted

## 2015-03-20 NOTE — Telephone Encounter (Signed)
PA for Acyclovir submitted through Cover [employer]. Key: [id number] Pending review. Will await response.

## 2015-03-31 NOTE — Telephone Encounter (Signed)
PA for Acyclovir was denied. Will inform nurse and provider.

## 2015-04-01 NOTE — Telephone Encounter (Signed)
SN has reviewed message  Nothing further is neede

## 2015-04-15 DIAGNOSIS — H34831 Tributary (branch) retinal vein occlusion, right eye: Secondary | ICD-10-CM | POA: Diagnosis not present

## 2015-04-15 DIAGNOSIS — H40053 Ocular hypertension, bilateral: Secondary | ICD-10-CM | POA: Diagnosis not present

## 2015-04-22 ENCOUNTER — Telehealth: Payer: Self-pay | Admitting: Pulmonary Disease

## 2015-04-22 NOTE — Telephone Encounter (Signed)
Spoke with pt. Advised him that we do not have samples at this time. Nothing further was needed.

## 2015-04-23 ENCOUNTER — Ambulatory Visit: Payer: Medicare Other

## 2015-05-06 DIAGNOSIS — H348312 Tributary (branch) retinal vein occlusion, right eye, stable: Secondary | ICD-10-CM | POA: Diagnosis not present

## 2015-06-02 ENCOUNTER — Other Ambulatory Visit: Payer: Self-pay | Admitting: Pulmonary Disease

## 2015-06-02 NOTE — Telephone Encounter (Signed)
Electronic refill request received for Xanax 0.5 mg  Last OV 03/11/2015, pending OV 03/11/2016.  Last filled 03/12/2015 # 50 with 1 refill  TAKE ONE-HALF TO ONE TABLET BY MOUTH THREE TIMES DAILY AS NEEDED  Dr Lenna Gilford please advise if ok to refill.  Thanks!

## 2015-06-03 NOTE — Telephone Encounter (Signed)
Per SN>>Ok to refill with 3 additional refills  Refill called into pt's pharmacy  Nothing further is needed

## 2015-07-18 ENCOUNTER — Telehealth: Payer: Self-pay | Admitting: Pulmonary Disease

## 2015-07-18 NOTE — Telephone Encounter (Signed)
Pt stated he received flu shot on 10.5.16 Flu shot documented Nothing further needed; will sign off

## 2015-07-18 NOTE — Telephone Encounter (Signed)
Per crystal, will forward to her, JJ and Joellen Jersey.

## 2015-08-27 ENCOUNTER — Other Ambulatory Visit: Payer: Self-pay | Admitting: Pulmonary Disease

## 2015-09-06 ENCOUNTER — Encounter (HOSPITAL_COMMUNITY): Payer: Self-pay | Admitting: Emergency Medicine

## 2015-09-06 ENCOUNTER — Emergency Department (INDEPENDENT_AMBULATORY_CARE_PROVIDER_SITE_OTHER): Payer: Medicare Other

## 2015-09-06 ENCOUNTER — Emergency Department (INDEPENDENT_AMBULATORY_CARE_PROVIDER_SITE_OTHER)
Admission: EM | Admit: 2015-09-06 | Discharge: 2015-09-06 | Disposition: A | Discharge status: Home / Self Care | Payer: Medicare Other | Source: Home / Self Care | Attending: Emergency Medicine | Admitting: Emergency Medicine

## 2015-09-06 DIAGNOSIS — R05 Cough: Secondary | ICD-10-CM | POA: Diagnosis not present

## 2015-09-06 DIAGNOSIS — R6889 Other general symptoms and signs: Secondary | ICD-10-CM

## 2015-09-06 DIAGNOSIS — R509 Fever, unspecified: Secondary | ICD-10-CM | POA: Diagnosis not present

## 2015-09-06 MED ORDER — HYDROCODONE-HOMATROPINE 5-1.5 MG/5ML PO SYRP: 5.0000 mL | ORAL_SOLUTION | Freq: Four times a day (QID) | ORAL | Status: DC | PRN
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
## 2015-09-06 NOTE — Discharge Instructions (Signed)
Your x-ray is normal. You had the flu. It is probably going to be another week before you get your energy back. Use the Hycodan every 4 hours as needed for cough. Do not drive while taking this medicine. You should start to see some improvement in the next 2-3 days. If your fevers come back or you are getting worse, please follow-up here or with Dr. Lenna Gilford.

## 2015-09-06 NOTE — ED Notes (Signed)
The patient presented to the [facility] with a complaint of a fever, cough, and headache for 4 days.

## 2015-09-16 DIAGNOSIS — H40053 Ocular hypertension, bilateral: Secondary | ICD-10-CM | POA: Diagnosis not present

## 2015-09-16 DIAGNOSIS — H2513 Age-related nuclear cataract, bilateral: Secondary | ICD-10-CM | POA: Diagnosis not present

## 2015-11-11 DIAGNOSIS — H43811 Vitreous degeneration, right eye: Secondary | ICD-10-CM | POA: Diagnosis not present

## 2015-11-11 DIAGNOSIS — H348312 Tributary (branch) retinal vein occlusion, right eye, stable: Secondary | ICD-10-CM | POA: Diagnosis not present

## 2015-11-11 DIAGNOSIS — H353122 Nonexudative age-related macular degeneration, left eye, intermediate dry stage: Secondary | ICD-10-CM | POA: Diagnosis not present

## 2015-11-11 DIAGNOSIS — H353112 Nonexudative age-related macular degeneration, right eye, intermediate dry stage: Secondary | ICD-10-CM | POA: Diagnosis not present

## 2015-11-26 ENCOUNTER — Other Ambulatory Visit: Payer: Self-pay | Admitting: Pulmonary Disease

## 2015-11-27 ENCOUNTER — Other Ambulatory Visit: Payer: Self-pay | Admitting: Pulmonary Disease

## 2015-11-27 ENCOUNTER — Telehealth: Payer: Self-pay | Admitting: Pulmonary Disease

## 2015-11-27 MED ORDER — TEMAZEPAM 30 MG PO CAPS: 30.0000 mg | ORAL_CAPSULE | Freq: Every evening | ORAL | Status: DC | PRN

## 2015-11-27 NOTE — Telephone Encounter (Signed)
Last refilled: 08/27/15 Last OV: 03/11/15 Next OV:  03/11/16  Ok to refill Temazepam?  Please advise.  Current Outpatient Prescriptions on File Prior to Visit  Medication Sig Dispense Refill  . acyclovir ointment (ZOVIRAX) 5 % Take as directed 15 g prn  . ALPRAZolam (XANAX) 0.5 MG tablet TAKE ONE-HALF TO ONE TABLET BY MOUTH THREE TIMES DAILY AS NEEDED 50 tablet 3  . aspirin 81 MG tablet Take 81 mg by mouth daily.      Marland Kitchen atorvastatin (LIPITOR) 20 MG tablet Take 1/2 tablet by mouth daily 15 tablet 11  . bimatoprost (LUMIGAN) 0.01 % SOLN Place 1 drop into both eyes at bedtime.     Marland Kitchen HYDROcodone-homatropine (HYCODAN) 5-1.5 MG/5ML syrup Take 5 mLs by mouth every 6 (six) hours as needed for cough. 120 mL 0  . metoprolol succinate (TOPROL-XL) 50 MG 24 hr tablet Take 1 tablet (50 mg total) by mouth daily. 30 tablet 11  . Multiple Vitamin (MULTIVITAMIN) tablet Take 1 tablet by mouth daily.      . nabumetone (RELAFEN) 750 MG tablet Take 1 tablet (750 mg total) by mouth 2 (two) times daily. For arthritis 60 tablet 11  . omeprazole (PRILOSEC) 20 MG capsule Take 1 capsule (20 mg total) by mouth daily. 30 capsule 6  . pantoprazole (PROTONIX) 40 MG tablet Take 1 tablet (40 mg total) by mouth daily. (Patient not taking: Reported on 03/11/2015) 90 tablet 3  . temazepam (RESTORIL) 30 MG capsule TAKE ONE CAPSULE BY MOUTH AT BEDTIME AS NEEDED FOR SLEEP 30 capsule 2  . tiotropium (SPIRIVA HANDIHALER) 18 MCG inhalation capsule Place 1 capsule (18 mcg total) into inhaler and inhale daily. 30 capsule 11   No current facility-administered medications on file prior to visit.   Allergies  Allergen Reactions  . Augmentin [Amoxicillin-Pot Clavulanate] Nausea And Vomiting  . Doxycycline Nausea Only    Severe nausea

## 2015-11-27 NOTE — Telephone Encounter (Signed)
Per SN: okay to refill, #30 x 2 refills. Take 1 po qhs.

## 2015-11-27 NOTE — Telephone Encounter (Signed)
Pt aware that Rx is being refilled to pharmacy.  Phoned in to [facility]. Nothing further needed.

## 2016-01-06 ENCOUNTER — Other Ambulatory Visit: Payer: Self-pay | Admitting: Pulmonary Disease

## 2016-01-07 NOTE — Telephone Encounter (Signed)
Refill telephoned to [facility], pharmacist at [facility]

## 2016-02-24 ENCOUNTER — Other Ambulatory Visit: Payer: Self-pay | Admitting: Pulmonary Disease

## 2016-02-24 NOTE — Telephone Encounter (Signed)
Pharmacy is requesting refill of Temazepam for pt. Per pharmacy last refill was 01/27/16.   Last fill  01/27/16, 12/27/15, 11/27/15 LOV  03/11/15 Next OV 03/16/16   SN - Please advise if ok to refill. Thanks!   Allergies  Allergen Reactions  . Augmentin [Amoxicillin-Pot Clavulanate] Nausea And Vomiting  . Doxycycline Nausea Only    Severe nausea    Current Outpatient Prescriptions on File Prior to Visit  Medication Sig Dispense Refill  . acyclovir ointment (ZOVIRAX) 5 % Take as directed 15 g prn  . ALPRAZolam (XANAX) 0.5 MG tablet TAKE ONE-HALF TO ONE TABLET BY MOUTH THREE TIMES DAILY AS NEEDED FOR ANXIETY 50 tablet 3  . aspirin 81 MG tablet Take 81 mg by mouth daily.      Marland Kitchen atorvastatin (LIPITOR) 20 MG tablet Take 1/2 tablet by mouth daily 15 tablet 11  . bimatoprost (LUMIGAN) 0.01 % SOLN Place 1 drop into both eyes at bedtime.     Marland Kitchen HYDROcodone-homatropine (HYCODAN) 5-1.5 MG/5ML syrup Take 5 mLs by mouth every 6 (six) hours as needed for cough. 120 mL 0  . metoprolol succinate (TOPROL-XL) 50 MG 24 hr tablet Take 1 tablet (50 mg total) by mouth daily. 30 tablet 11  . Multiple Vitamin (MULTIVITAMIN) tablet Take 1 tablet by mouth daily.      . nabumetone (RELAFEN) 750 MG tablet Take 1 tablet (750 mg total) by mouth 2 (two) times daily. For arthritis 60 tablet 11  . omeprazole (PRILOSEC) 20 MG capsule Take 1 capsule (20 mg total) by mouth daily. 30 capsule 6  . pantoprazole (PROTONIX) 40 MG tablet Take 1 tablet (40 mg total) by mouth daily. (Patient not taking: Reported on 03/11/2015) 90 tablet 3  . temazepam (RESTORIL) 30 MG capsule Take 1 capsule (30 mg total) by mouth at bedtime as needed. for sleep 30 capsule 2  . tiotropium (SPIRIVA HANDIHALER) 18 MCG inhalation capsule Place 1 capsule (18 mcg total) into inhaler and inhale daily. 30 capsule 11   No current facility-administered medications on file prior to visit.

## 2016-02-24 NOTE — Telephone Encounter (Signed)
Per SN: ok to refill #30 x 5 refills. 1 po QHS.  RX hs been called in.

## 2016-03-11 ENCOUNTER — Ambulatory Visit: Payer: Medicare Other | Admitting: Pulmonary Disease

## 2016-03-16 ENCOUNTER — Encounter: Payer: Self-pay | Admitting: Pulmonary Disease

## 2016-03-16 ENCOUNTER — Other Ambulatory Visit (INDEPENDENT_AMBULATORY_CARE_PROVIDER_SITE_OTHER): Payer: Medicare Other

## 2016-03-16 ENCOUNTER — Ambulatory Visit (INDEPENDENT_AMBULATORY_CARE_PROVIDER_SITE_OTHER): Payer: Medicare Other | Admitting: Pulmonary Disease

## 2016-03-16 VITALS — BP 134/86 | HR 60 | Temp 96.7°F | Ht 72.0 in | Wt 171.0 lb

## 2016-03-16 DIAGNOSIS — D126 Benign neoplasm of colon, unspecified: Secondary | ICD-10-CM

## 2016-03-16 DIAGNOSIS — N138 Other obstructive and reflux uropathy: Secondary | ICD-10-CM

## 2016-03-16 DIAGNOSIS — R946 Abnormal results of thyroid function studies: Secondary | ICD-10-CM | POA: Diagnosis not present

## 2016-03-16 DIAGNOSIS — N401 Enlarged prostate with lower urinary tract symptoms: Secondary | ICD-10-CM | POA: Diagnosis not present

## 2016-03-16 DIAGNOSIS — R7989 Other specified abnormal findings of blood chemistry: Secondary | ICD-10-CM

## 2016-03-16 DIAGNOSIS — E7800 Pure hypercholesterolemia, unspecified: Secondary | ICD-10-CM

## 2016-03-16 DIAGNOSIS — F411 Generalized anxiety disorder: Secondary | ICD-10-CM

## 2016-03-16 DIAGNOSIS — I10 Essential (primary) hypertension: Secondary | ICD-10-CM

## 2016-03-16 DIAGNOSIS — M150 Primary generalized (osteo)arthritis: Secondary | ICD-10-CM

## 2016-03-16 DIAGNOSIS — M159 Polyosteoarthritis, unspecified: Secondary | ICD-10-CM

## 2016-03-16 DIAGNOSIS — Z87891 Personal history of nicotine dependence: Secondary | ICD-10-CM

## 2016-03-16 DIAGNOSIS — K5730 Diverticulosis of large intestine without perforation or abscess without bleeding: Secondary | ICD-10-CM

## 2016-03-16 DIAGNOSIS — I8393 Asymptomatic varicose veins of bilateral lower extremities: Secondary | ICD-10-CM

## 2016-03-16 DIAGNOSIS — J449 Chronic obstructive pulmonary disease, unspecified: Secondary | ICD-10-CM

## 2016-03-16 DIAGNOSIS — I2510 Atherosclerotic heart disease of native coronary artery without angina pectoris: Secondary | ICD-10-CM

## 2016-03-16 LAB — LIPID PANEL
CHOLESTEROL: 165 mg/dL (ref 0–200)
HDL: 56.3 mg/dL (ref >39.00)
LDL Cholesterol: 89 mg/dL (ref 0–99)
NonHDL: 108.21
TRIGLYCERIDES: 96 mg/dL (ref 0.0–149.0)
Total CHOL/HDL Ratio: 3
VLDL: 19.2 mg/dL (ref 0.0–40.0)

## 2016-03-16 LAB — COMPREHENSIVE METABOLIC PANEL
ALBUMIN: 4.6 g/dL (ref 3.5–5.2)
ALK PHOS: 83 U/L (ref 39–117)
ALT: 12 U/L (ref 0–53)
AST: 17 U/L (ref 0–37)
BILIRUBIN TOTAL: 1 mg/dL (ref 0.2–1.2)
BUN: 18 mg/dL (ref 6–23)
CALCIUM: 9.3 mg/dL (ref 8.4–10.5)
CO2: 33 mEq/L — ABNORMAL HIGH (ref 19–32)
CREATININE: 1.13 mg/dL (ref 0.40–1.50)
Chloride: 103 mEq/L (ref 96–112)
GFR: 67.37 mL/min (ref >60.00)
Glucose, Bld: 93 mg/dL (ref 70–99)
Potassium: 5.2 mEq/L — ABNORMAL HIGH (ref 3.5–5.1)
Sodium: 139 mEq/L (ref 135–145)
TOTAL PROTEIN: 7.3 g/dL (ref 6.0–8.3)

## 2016-03-16 LAB — CBC WITH DIFFERENTIAL/PLATELET
BASOS PCT: 0.4 % (ref 0.0–3.0)
Basophils Absolute: 0 10*3/uL (ref 0.0–0.1)
EOS PCT: 5.1 % — AB (ref 0.0–5.0)
Eosinophils Absolute: 0.4 10*3/uL (ref 0.0–0.7)
HEMATOCRIT: 42.3 % (ref 39.0–52.0)
HEMOGLOBIN: 14.7 g/dL (ref 13.0–17.0)
LYMPHS PCT: 19.6 % (ref 12.0–46.0)
Lymphs Abs: 1.6 10*3/uL (ref 0.7–4.0)
MCHC: 34.7 g/dL (ref 30.0–36.0)
MCV: 92.1 fl (ref 78.0–100.0)
Monocytes Absolute: 0.8 10*3/uL (ref 0.1–1.0)
Monocytes Relative: 9.8 % (ref 3.0–12.0)
Neutro Abs: 5.4 10*3/uL (ref 1.4–7.7)
Neutrophils Relative %: 65.1 % (ref 43.0–77.0)
Platelets: 191 10*3/uL (ref 150.0–400.0)
RBC: 4.6 Mil/uL (ref 4.22–5.81)
RDW: 13.6 % (ref 11.5–15.5)
WBC: 8.3 10*3/uL (ref 4.0–10.5)

## 2016-03-16 LAB — PSA: PSA: 1.69 ng/mL (ref 0.10–4.00)

## 2016-03-16 LAB — TSH: TSH: 0.58 u[IU]/mL (ref 0.35–4.50)

## 2016-03-16 MED ORDER — NABUMETONE 750 MG PO TABS: 750.0000 mg | ORAL_TABLET | Freq: Two times a day (BID) | ORAL | 11 refills | Status: DC

## 2016-03-16 MED ORDER — METOPROLOL SUCCINATE ER 50 MG PO TB24: 50.0000 mg | ORAL_TABLET | Freq: Every day | ORAL | 11 refills | Status: DC

## 2016-03-16 MED ORDER — TEMAZEPAM 30 MG PO CAPS: 30.0000 mg | ORAL_CAPSULE | Freq: Every evening | ORAL | 5 refills | Status: DC | PRN

## 2016-03-16 MED ORDER — OMEPRAZOLE 20 MG PO CPDR: 20.0000 mg | DELAYED_RELEASE_CAPSULE | Freq: Every day | ORAL | 6 refills | Status: DC

## 2016-03-16 MED ORDER — ALPRAZOLAM 0.5 MG PO TABS: ORAL_TABLET | ORAL | 3 refills | Status: DC

## 2016-03-16 MED ORDER — ATORVASTATIN CALCIUM 20 MG PO TABS: ORAL_TABLET | ORAL | 11 refills | Status: DC

## 2016-03-16 MED ORDER — UMECLIDINIUM BROMIDE 62.5 MCG/INH IN AEPB: 1.0000 | INHALATION_SPRAY | Freq: Every day | RESPIRATORY_TRACT | 0 refills | Status: DC

## 2016-03-16 MED ORDER — METOPROLOL SUCCINATE ER 50 MG PO TB24
50.0000 mg | ORAL_TABLET | Freq: Every day | ORAL | 11 refills | Status: DC
Start: 2016-03-16 — End: 2017-03-09
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
## 2016-03-16 NOTE — Patient Instructions (Signed)
Today we updated your med list in our EPIC system...    Continue your current medications the same...  Keep up the good work w/ DIET & EXERCISE...  Today we did your follow up Spirometry breathing test 7 FASTING blood work...    We will contact you w/ the results when available...   Call for any questions...  Let's plan a follow up visit in 44yr, sooner if needed for any problems.Marland KitchenMarland Kitchen

## 2016-03-23 ENCOUNTER — Telehealth: Payer: Self-pay | Admitting: Pulmonary Disease

## 2016-03-23 NOTE — Telephone Encounter (Signed)
Pt called back and he is aware of results per SN.   

## 2016-03-23 NOTE — Telephone Encounter (Signed)
Please notify patient> LABS all look great...  FLP is ok- all parameters at goals on Lipitor20- 1/2 Qd- continue same + low fat diet...  Chems, CBC, Thyroid, PSA are ALL WNL.Marland Kitchen.    lmomtcb x 1 for pt to make him aware of results.

## 2016-04-20 DIAGNOSIS — H401111 Primary open-angle glaucoma, right eye, mild stage: Secondary | ICD-10-CM | POA: Diagnosis not present

## 2016-04-20 DIAGNOSIS — H524 Presbyopia: Secondary | ICD-10-CM | POA: Diagnosis not present

## 2016-04-20 DIAGNOSIS — H401122 Primary open-angle glaucoma, left eye, moderate stage: Secondary | ICD-10-CM | POA: Diagnosis not present

## 2016-04-20 DIAGNOSIS — H353131 Nonexudative age-related macular degeneration, bilateral, early dry stage: Secondary | ICD-10-CM | POA: Diagnosis not present

## 2016-04-23 ENCOUNTER — Ambulatory Visit (INDEPENDENT_AMBULATORY_CARE_PROVIDER_SITE_OTHER): Payer: Medicare Other

## 2016-04-23 DIAGNOSIS — Z23 Encounter for immunization: Secondary | ICD-10-CM

## 2016-05-07 ENCOUNTER — Encounter: Payer: Self-pay | Admitting: Internal Medicine

## 2016-05-10 ENCOUNTER — Encounter: Payer: Self-pay | Admitting: Internal Medicine

## 2016-07-14 ENCOUNTER — Ambulatory Visit (AMBULATORY_SURGERY_CENTER): Payer: Self-pay | Admitting: *Deleted

## 2016-07-14 VITALS — Ht 72.0 in | Wt 175.0 lb

## 2016-07-14 DIAGNOSIS — Z1211 Encounter for screening for malignant neoplasm of colon: Secondary | ICD-10-CM

## 2016-07-14 MED ORDER — NA SULFATE-K SULFATE-MG SULF 17.5-3.13-1.6 GM/177ML PO SOLN: ORAL | 0 refills | Status: DC

## 2016-07-14 NOTE — Progress Notes (Signed)
Patient denies any allergies to eggs or soy. Patient denies any problems with anesthesia/sedation. Patient denies any oxygen use at home and does not take any diet/weight loss medications. Patient did miralax prep last time and he request that prep again IF suprep cost too much. Sent Suprep to pharmacy. Miralax and Suprep instructions given to pt.

## 2016-08-04 ENCOUNTER — Encounter: Payer: Medicare Other | Admitting: Internal Medicine

## 2016-08-04 ENCOUNTER — Telehealth: Payer: Self-pay | Admitting: Internal Medicine

## 2016-08-04 NOTE — Telephone Encounter (Signed)
Noted. Sorry  

## 2016-09-20 ENCOUNTER — Other Ambulatory Visit: Payer: Self-pay | Admitting: Pulmonary Disease

## 2016-09-22 ENCOUNTER — Telehealth: Payer: Self-pay | Admitting: Pulmonary Disease

## 2016-09-22 MED ORDER — TEMAZEPAM 30 MG PO CAPS: 30.0000 mg | ORAL_CAPSULE | Freq: Every evening | ORAL | 5 refills | Status: DC | PRN

## 2016-09-22 NOTE — Telephone Encounter (Signed)
Spoke with pt's wife, Murray Hodgkins. Pt is needing a new prescription called in for Temazepam. He has a pending OV with SN in August. Rx has been called in. Nothing further was needed.

## 2016-10-12 DIAGNOSIS — H2513 Age-related nuclear cataract, bilateral: Secondary | ICD-10-CM | POA: Diagnosis not present

## 2016-10-12 DIAGNOSIS — H401131 Primary open-angle glaucoma, bilateral, mild stage: Secondary | ICD-10-CM | POA: Diagnosis not present

## 2016-11-10 ENCOUNTER — Other Ambulatory Visit: Payer: Self-pay | Admitting: Pulmonary Disease

## 2016-12-09 ENCOUNTER — Telehealth: Payer: Self-pay | Admitting: Pulmonary Disease

## 2016-12-09 NOTE — Telephone Encounter (Signed)
Pt's spouse, Murray Hodgkins, is aware that we do not currently have any samples of incruse. Murray Hodgkins states incruse has a high co-pay. I asked Murray Hodgkins if pt has apply for patient assistant, Murray Hodgkins states she does not think pt would qualify. Murray Hodgkins states pt will discuss this further with Dr. Lenna Gilford at this upcoming on 03/16/17. Nothing further needed.

## 2017-03-09 ENCOUNTER — Other Ambulatory Visit: Payer: Self-pay | Admitting: Pulmonary Disease

## 2017-03-16 ENCOUNTER — Encounter: Payer: Self-pay | Admitting: Pulmonary Disease

## 2017-03-16 ENCOUNTER — Ambulatory Visit (INDEPENDENT_AMBULATORY_CARE_PROVIDER_SITE_OTHER): Payer: Medicare Other | Admitting: Pulmonary Disease

## 2017-03-16 ENCOUNTER — Other Ambulatory Visit (INDEPENDENT_AMBULATORY_CARE_PROVIDER_SITE_OTHER): Payer: Medicare Other

## 2017-03-16 ENCOUNTER — Ambulatory Visit (INDEPENDENT_AMBULATORY_CARE_PROVIDER_SITE_OTHER)
Admission: RE | Admit: 2017-03-16 | Discharge: 2017-03-16 | Disposition: A | Discharge status: Home / Self Care | Payer: Medicare Other | Source: Ambulatory Visit | Attending: Pulmonary Disease | Admitting: Pulmonary Disease

## 2017-03-16 VITALS — BP 142/72 | HR 68 | Temp 97.8°F | Ht 72.0 in | Wt 175.1 lb

## 2017-03-16 DIAGNOSIS — I2510 Atherosclerotic heart disease of native coronary artery without angina pectoris: Secondary | ICD-10-CM

## 2017-03-16 DIAGNOSIS — I10 Essential (primary) hypertension: Secondary | ICD-10-CM

## 2017-03-16 DIAGNOSIS — E7800 Pure hypercholesterolemia, unspecified: Secondary | ICD-10-CM | POA: Diagnosis not present

## 2017-03-16 DIAGNOSIS — K5730 Diverticulosis of large intestine without perforation or abscess without bleeding: Secondary | ICD-10-CM

## 2017-03-16 DIAGNOSIS — Z87891 Personal history of nicotine dependence: Secondary | ICD-10-CM | POA: Diagnosis not present

## 2017-03-16 DIAGNOSIS — N138 Other obstructive and reflux uropathy: Secondary | ICD-10-CM | POA: Diagnosis not present

## 2017-03-16 DIAGNOSIS — M150 Primary generalized (osteo)arthritis: Secondary | ICD-10-CM

## 2017-03-16 DIAGNOSIS — F411 Generalized anxiety disorder: Secondary | ICD-10-CM

## 2017-03-16 DIAGNOSIS — D126 Benign neoplasm of colon, unspecified: Secondary | ICD-10-CM

## 2017-03-16 DIAGNOSIS — J449 Chronic obstructive pulmonary disease, unspecified: Secondary | ICD-10-CM | POA: Diagnosis not present

## 2017-03-16 DIAGNOSIS — M159 Polyosteoarthritis, unspecified: Secondary | ICD-10-CM

## 2017-03-16 DIAGNOSIS — I8390 Asymptomatic varicose veins of unspecified lower extremity: Secondary | ICD-10-CM

## 2017-03-16 DIAGNOSIS — N401 Enlarged prostate with lower urinary tract symptoms: Secondary | ICD-10-CM

## 2017-03-16 LAB — CBC WITH DIFFERENTIAL/PLATELET
Basophils Absolute: 0.1 10*3/uL (ref 0.0–0.1)
Basophils Relative: 0.6 % (ref 0.0–3.0)
EOS ABS: 0.3 10*3/uL (ref 0.0–0.7)
Eosinophils Relative: 2.1 % (ref 0.0–5.0)
HCT: 42.6 % (ref 39.0–52.0)
HEMOGLOBIN: 14.3 g/dL (ref 13.0–17.0)
Lymphocytes Relative: 12.7 % (ref 12.0–46.0)
Lymphs Abs: 1.6 10*3/uL (ref 0.7–4.0)
MCHC: 33.6 g/dL (ref 30.0–36.0)
MCV: 95.1 fl (ref 78.0–100.0)
MONO ABS: 1.1 10*3/uL — AB (ref 0.1–1.0)
Monocytes Relative: 8.8 % (ref 3.0–12.0)
Neutro Abs: 9.4 10*3/uL — ABNORMAL HIGH (ref 1.4–7.7)
Neutrophils Relative %: 75.8 % (ref 43.0–77.0)
Platelets: 187 10*3/uL (ref 150.0–400.0)
RBC: 4.48 Mil/uL (ref 4.22–5.81)
RDW: 13.8 % (ref 11.5–15.5)
WBC: 12.4 10*3/uL — AB (ref 4.0–10.5)

## 2017-03-16 LAB — COMPREHENSIVE METABOLIC PANEL
ALT: 12 U/L (ref 0–53)
AST: 17 U/L (ref 0–37)
Albumin: 4.5 g/dL (ref 3.5–5.2)
Alkaline Phosphatase: 74 U/L (ref 39–117)
BILIRUBIN TOTAL: 1.2 mg/dL (ref 0.2–1.2)
BUN: 19 mg/dL (ref 6–23)
CO2: 31 meq/L (ref 19–32)
CREATININE: 1.01 mg/dL (ref 0.40–1.50)
Calcium: 9.1 mg/dL (ref 8.4–10.5)
Chloride: 102 mEq/L (ref 96–112)
GFR: 76.48 mL/min (ref >60.00)
GLUCOSE: 98 mg/dL (ref 70–99)
Potassium: 4.3 mEq/L (ref 3.5–5.1)
SODIUM: 139 meq/L (ref 135–145)
TOTAL PROTEIN: 7.2 g/dL (ref 6.0–8.3)

## 2017-03-16 LAB — LIPID PANEL
CHOL/HDL RATIO: 3
Cholesterol: 141 mg/dL (ref 0–200)
HDL: 49.7 mg/dL (ref >39.00)
LDL Cholesterol: 74 mg/dL (ref 0–99)
NONHDL: 91.68
Triglycerides: 88 mg/dL (ref 0.0–149.0)
VLDL: 17.6 mg/dL (ref 0.0–40.0)

## 2017-03-16 LAB — PSA: PSA: 1.92 ng/mL (ref 0.10–4.00)

## 2017-03-16 LAB — TSH: TSH: 0.61 u[IU]/mL (ref 0.35–4.50)

## 2017-03-16 MED ORDER — CETIRIZINE HCL 10 MG PO TABS: 10.0000 mg | ORAL_TABLET | Freq: Every day | ORAL | 11 refills | Status: DC

## 2017-03-16 MED ORDER — OMEPRAZOLE 20 MG PO CPDR: 20.0000 mg | DELAYED_RELEASE_CAPSULE | Freq: Every day | ORAL | 11 refills | Status: DC

## 2017-03-16 MED ORDER — NABUMETONE 750 MG PO TABS: 750.0000 mg | ORAL_TABLET | Freq: Two times a day (BID) | ORAL | 11 refills | Status: DC

## 2017-03-16 MED ORDER — ATORVASTATIN CALCIUM 20 MG PO TABS: ORAL_TABLET | ORAL | 11 refills | Status: DC

## 2017-03-16 MED ORDER — ALPRAZOLAM 0.5 MG PO TABS: ORAL_TABLET | ORAL | 5 refills | Status: DC

## 2017-03-16 MED ORDER — METOPROLOL SUCCINATE ER 50 MG PO TB24: 50.0000 mg | ORAL_TABLET | Freq: Every day | ORAL | 11 refills | Status: DC

## 2017-03-16 MED ORDER — TEMAZEPAM 30 MG PO CAPS: 30.0000 mg | ORAL_CAPSULE | Freq: Every evening | ORAL | 5 refills | Status: DC | PRN

## 2017-03-16 NOTE — Patient Instructions (Signed)
Today we updated your med list in our EPIC system...    Continue your current medications the same...  Today we checked a follow up CXR, EKG, & FASTING blood work...    We will contact you w/ the results when available...   Keep up the good work w/ diet & exercise-- keep moving!!!  Call for any questions...  Let's plan a follow up visit in 63yr, sooner if needed for any problems.Marland KitchenMarland Kitchen

## 2017-03-16 NOTE — Progress Notes (Signed)
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 (TOPROL-XL) 50 MG 24 HR TABLET metoprolol succinate (TOPROL-XL) 50 MG 24 hr tablet      Take 1 tablet (50 mg total) by mouth daily. Take with or immediately following a meal.    TAKE ONE TABLET BY MOUTH ONCE DAILY   NABUMETONE (RELAFEN) 750 MG TABLET nabumetone (RELAFEN) 750 MG tablet      Take 1 tablet (750 mg total) by mouth 2 (two) times daily. For arthritis    Take 1 tablet (750 mg total) by mouth 2 (two) times daily. For arthritis   OMEPRAZOLE (PRILOSEC) 20 MG CAPSULE omeprazole (PRILOSEC) 20 MG capsule      Take 1 capsule (20 mg total) by mouth daily.    Take 1 capsule (20 mg total) by mouth daily.   TEMAZEPAM (RESTORIL) 30 MG CAPSULE temazepam (RESTORIL) 30 MG capsule      Take 1 capsule (30 mg total) by mouth at bedtime as needed. for sleep    Take 1 capsule (30 mg total) by mouth at bedtime as needed. for sleep  Discontinued Medications   NA SULFATE-K SULFATE-MG SULF 17.5-3.13-1.6 GM/180ML SOLN    Suprep (no substitutions)-TAKE AS DIRECTED.

## 2017-04-26 ENCOUNTER — Ambulatory Visit (INDEPENDENT_AMBULATORY_CARE_PROVIDER_SITE_OTHER): Payer: Medicare Other

## 2017-04-26 DIAGNOSIS — H2513 Age-related nuclear cataract, bilateral: Secondary | ICD-10-CM | POA: Diagnosis not present

## 2017-04-26 DIAGNOSIS — H52203 Unspecified astigmatism, bilateral: Secondary | ICD-10-CM | POA: Diagnosis not present

## 2017-04-26 DIAGNOSIS — Z23 Encounter for immunization: Secondary | ICD-10-CM | POA: Diagnosis not present

## 2017-04-26 DIAGNOSIS — H401131 Primary open-angle glaucoma, bilateral, mild stage: Secondary | ICD-10-CM | POA: Diagnosis not present

## 2017-09-14 ENCOUNTER — Telehealth: Payer: Self-pay | Admitting: Pulmonary Disease

## 2017-09-14 NOTE — Telephone Encounter (Signed)
Called and spoke with pt letting him know that I changed his pharmacy to the new one he will be using of [facility] in [place] off [address].  Pt expressed understanding. Nothing further needed at this current time.

## 2017-09-20 ENCOUNTER — Other Ambulatory Visit: Payer: Self-pay | Admitting: Pulmonary Disease

## 2017-09-20 NOTE — Telephone Encounter (Signed)
Pt requesting a refill on Temazepam 30mg . Last refill:03/16/17 #30 with 5 refills Last OV: 03/16/17 Next OV: 03/20/18  SN please advise if ok to refill.  Thanks!

## 2017-09-21 NOTE — Telephone Encounter (Signed)
Per SN- Ok to refill Temazepam 30mg , Take 1 capsule by mouth at bedtime as needed for sleep, #30 with 5 refills. Called in at [facility], [place].  Nothing further needed.

## 2017-10-13 DIAGNOSIS — H2513 Age-related nuclear cataract, bilateral: Secondary | ICD-10-CM | POA: Diagnosis not present

## 2017-10-13 DIAGNOSIS — H353131 Nonexudative age-related macular degeneration, bilateral, early dry stage: Secondary | ICD-10-CM | POA: Diagnosis not present

## 2017-10-13 DIAGNOSIS — H349 Unspecified retinal vascular occlusion: Secondary | ICD-10-CM | POA: Diagnosis not present

## 2017-10-13 DIAGNOSIS — H401131 Primary open-angle glaucoma, bilateral, mild stage: Secondary | ICD-10-CM | POA: Diagnosis not present

## 2017-10-20 ENCOUNTER — Other Ambulatory Visit: Payer: Self-pay | Admitting: Pulmonary Disease

## 2017-11-09 ENCOUNTER — Other Ambulatory Visit: Payer: Self-pay | Admitting: Pulmonary Disease

## 2018-01-27 ENCOUNTER — Telehealth: Payer: Self-pay | Admitting: Pulmonary Disease

## 2018-01-27 NOTE — Telephone Encounter (Signed)
Received Prior Authorization for Temazepam 30mg , take 1 by mouth as needed for sleep.  [facility] in [place], [phone].  Spoke to [place].  PA request not needed.  Jinny Blossom stated that Patient had already picked up Prescription.  Nothing further at this time.

## 2018-03-21 ENCOUNTER — Ambulatory Visit: Payer: Medicare Other | Admitting: Pulmonary Disease

## 2018-03-21 ENCOUNTER — Encounter: Payer: Self-pay | Admitting: Pulmonary Disease

## 2018-03-21 ENCOUNTER — Ambulatory Visit (INDEPENDENT_AMBULATORY_CARE_PROVIDER_SITE_OTHER): Payer: Medicare Other | Admitting: Pulmonary Disease

## 2018-03-21 ENCOUNTER — Ambulatory Visit (INDEPENDENT_AMBULATORY_CARE_PROVIDER_SITE_OTHER)
Admission: RE | Admit: 2018-03-21 | Discharge: 2018-03-21 | Disposition: A | Discharge status: Home / Self Care | Payer: Medicare Other | Source: Ambulatory Visit | Attending: Pulmonary Disease | Admitting: Pulmonary Disease

## 2018-03-21 ENCOUNTER — Other Ambulatory Visit (INDEPENDENT_AMBULATORY_CARE_PROVIDER_SITE_OTHER): Payer: Medicare Other

## 2018-03-21 VITALS — BP 130/80 | HR 60 | Temp 97.5°F | Ht 72.0 in | Wt 174.0 lb

## 2018-03-21 DIAGNOSIS — D126 Benign neoplasm of colon, unspecified: Secondary | ICD-10-CM

## 2018-03-21 DIAGNOSIS — I8390 Asymptomatic varicose veins of unspecified lower extremity: Secondary | ICD-10-CM

## 2018-03-21 DIAGNOSIS — I2510 Atherosclerotic heart disease of native coronary artery without angina pectoris: Secondary | ICD-10-CM

## 2018-03-21 DIAGNOSIS — N401 Enlarged prostate with lower urinary tract symptoms: Secondary | ICD-10-CM

## 2018-03-21 DIAGNOSIS — E7800 Pure hypercholesterolemia, unspecified: Secondary | ICD-10-CM | POA: Diagnosis not present

## 2018-03-21 DIAGNOSIS — F411 Generalized anxiety disorder: Secondary | ICD-10-CM | POA: Diagnosis not present

## 2018-03-21 DIAGNOSIS — I10 Essential (primary) hypertension: Secondary | ICD-10-CM

## 2018-03-21 DIAGNOSIS — J449 Chronic obstructive pulmonary disease, unspecified: Secondary | ICD-10-CM

## 2018-03-21 DIAGNOSIS — M150 Primary generalized (osteo)arthritis: Secondary | ICD-10-CM

## 2018-03-21 DIAGNOSIS — K5730 Diverticulosis of large intestine without perforation or abscess without bleeding: Secondary | ICD-10-CM | POA: Diagnosis not present

## 2018-03-21 DIAGNOSIS — R079 Chest pain, unspecified: Secondary | ICD-10-CM | POA: Diagnosis not present

## 2018-03-21 DIAGNOSIS — N138 Other obstructive and reflux uropathy: Secondary | ICD-10-CM | POA: Diagnosis not present

## 2018-03-21 DIAGNOSIS — M159 Polyosteoarthritis, unspecified: Secondary | ICD-10-CM

## 2018-03-21 DIAGNOSIS — Z87891 Personal history of nicotine dependence: Secondary | ICD-10-CM | POA: Diagnosis not present

## 2018-03-21 LAB — CBC WITH DIFFERENTIAL/PLATELET
BASOS PCT: 0.9 % (ref 0.0–3.0)
Basophils Absolute: 0.1 10*3/uL (ref 0.0–0.1)
EOS PCT: 3.4 % (ref 0.0–5.0)
Eosinophils Absolute: 0.3 10*3/uL (ref 0.0–0.7)
HEMATOCRIT: 43.8 % (ref 39.0–52.0)
HEMOGLOBIN: 14.9 g/dL (ref 13.0–17.0)
LYMPHS PCT: 22 % (ref 12.0–46.0)
Lymphs Abs: 2 10*3/uL (ref 0.7–4.0)
MCHC: 34 g/dL (ref 30.0–36.0)
MCV: 92.4 fl (ref 78.0–100.0)
MONOS PCT: 9.3 % (ref 3.0–12.0)
Monocytes Absolute: 0.8 10*3/uL (ref 0.1–1.0)
NEUTROS ABS: 5.7 10*3/uL (ref 1.4–7.7)
Neutrophils Relative %: 64.4 % (ref 43.0–77.0)
PLATELETS: 208 10*3/uL (ref 150.0–400.0)
RBC: 4.74 Mil/uL (ref 4.22–5.81)
RDW: 13.5 % (ref 11.5–15.5)
WBC: 8.9 10*3/uL (ref 4.0–10.5)

## 2018-03-21 LAB — COMPREHENSIVE METABOLIC PANEL
ALBUMIN: 4.7 g/dL (ref 3.5–5.2)
ALT: 12 U/L (ref 0–53)
AST: 18 U/L (ref 0–37)
Alkaline Phosphatase: 99 U/L (ref 39–117)
BILIRUBIN TOTAL: 0.8 mg/dL (ref 0.2–1.2)
BUN: 21 mg/dL (ref 6–23)
CALCIUM: 9.5 mg/dL (ref 8.4–10.5)
CHLORIDE: 99 meq/L (ref 96–112)
CO2: 29 meq/L (ref 19–32)
CREATININE: 1.07 mg/dL (ref 0.40–1.50)
GFR: 71.36 mL/min (ref >60.00)
Glucose, Bld: 100 mg/dL — ABNORMAL HIGH (ref 70–99)
Potassium: 4.3 mEq/L (ref 3.5–5.1)
Sodium: 139 mEq/L (ref 135–145)
Total Protein: 7.7 g/dL (ref 6.0–8.3)

## 2018-03-21 LAB — URINALYSIS, ROUTINE W REFLEX MICROSCOPIC
BILIRUBIN URINE: NEGATIVE
KETONES UR: NEGATIVE
NITRITE: NEGATIVE
Total Protein, Urine: NEGATIVE
URINE GLUCOSE: NEGATIVE
Urobilinogen, UA: 0.2 (ref 0.0–1.0)
pH: 5.5 (ref 5.0–8.0)

## 2018-03-21 LAB — LIPID PANEL
CHOLESTEROL: 167 mg/dL (ref 0–200)
HDL: 52.5 mg/dL (ref >39.00)
LDL Cholesterol: 96 mg/dL (ref 0–99)
NonHDL: 114.59
TRIGLYCERIDES: 95 mg/dL (ref 0.0–149.0)
Total CHOL/HDL Ratio: 3
VLDL: 19 mg/dL (ref 0.0–40.0)

## 2018-03-21 LAB — TSH: TSH: 0.65 u[IU]/mL (ref 0.35–4.50)

## 2018-03-21 LAB — PSA: PSA: 2 ng/mL (ref 0.10–4.00)

## 2018-03-21 MED ORDER — ACYCLOVIR 5 % EX OINT: TOPICAL_OINTMENT | CUTANEOUS | 5 refills | Status: DC

## 2018-03-21 MED ORDER — METOPROLOL SUCCINATE ER 50 MG PO TB24: 50.0000 mg | ORAL_TABLET | Freq: Every day | ORAL | 6 refills | Status: DC

## 2018-03-21 MED ORDER — ATORVASTATIN CALCIUM 20 MG PO TABS: ORAL_TABLET | ORAL | 6 refills | Status: DC

## 2018-03-21 MED ORDER — ALPRAZOLAM 0.5 MG PO TABS: ORAL_TABLET | ORAL | 2 refills | Status: DC

## 2018-03-21 MED ORDER — TEMAZEPAM 30 MG PO CAPS: ORAL_CAPSULE | ORAL | 2 refills | Status: DC

## 2018-03-21 MED ORDER — NABUMETONE 750 MG PO TABS: 750.0000 mg | ORAL_TABLET | Freq: Two times a day (BID) | ORAL | 6 refills | Status: DC

## 2018-03-21 NOTE — Patient Instructions (Addendum)
Today we updated your med list in our EPIC system...    Continue your current medications the same...    We refilled your meds per request...  Today we did a follow up CXR, EKG, & blood work (+urinalysis)...    We will contact you w/ the results when available...   Remember to get some regular exercise- body & mind!!!  Call for any questions or if we can be of service in any way...  It has been my great honor to have been your doctor for these many years--    My best wishes to you & your family for continued health & happiness in the future.Marland KitchenMarland Kitchen

## 2018-03-22 LAB — URINE CULTURE
MICRO NUMBER:: 91049150
Result:: NO GROWTH
SPECIMEN QUALITY:: ADEQUATE

## 2018-03-24 ENCOUNTER — Encounter: Payer: Self-pay | Admitting: Pulmonary Disease

## 2018-03-24 ENCOUNTER — Telehealth: Payer: Self-pay | Admitting: Pulmonary Disease

## 2018-03-24 NOTE — Telephone Encounter (Addendum)
Received call from pt's spouse, who states doxy is not covered by insurance. Murray Hodgkins is requesting alternative.

## 2018-03-24 NOTE — Progress Notes (Signed)
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 Take with or immediately following a meal.    Take 1 tablet (50 mg total) by mouth daily. Take with or immediately following a meal.   NABUMETONE (RELAFEN) 750 MG TABLET nabumetone (RELAFEN) 750 MG tablet      Take 1 tablet (750 mg total) by mouth 2 (two) times daily. For arthritis    Take 1 tablet (750 mg total) by mouth 2 (two) times daily. For arthritis   TEMAZEPAM (RESTORIL) 30 MG CAPSULE temazepam (RESTORIL) 30 MG capsule      TAKE 1 CAPSULE BY MOUTH AT BEDTIME AS NEEDED FOR SLEEP    TAKE 1 CAPSULE BY MOUTH AT BEDTIME AS NEEDED FOR SLEEP  Discontinued Medications   No medications on file

## 2018-03-24 NOTE — Telephone Encounter (Signed)
Per SN- he called Doxycycline 100mg  #20, take BID to [facility] in [place].  Called and spoke with Pharmacy.  Kennyth Lose, with pharmacy stated that patient had [employer] and the cost was $73.02.  She stated that they do accept Goodrx coupons.  Called and spoke with Murray Hodgkins, patient's wife.  Explained Goodrx coupons and the cost was $18, with coupon.  She stated understanding.  Nothing further at this time.

## 2018-04-10 ENCOUNTER — Telehealth: Payer: Self-pay | Admitting: Pulmonary Disease

## 2018-04-10 MED ORDER — NABUMETONE 750 MG PO TABS: 750.0000 mg | ORAL_TABLET | Freq: Two times a day (BID) | ORAL | 6 refills | Status: DC

## 2018-04-10 MED ORDER — CETIRIZINE HCL 10 MG PO TABS: 10.0000 mg | ORAL_TABLET | Freq: Every day | ORAL | 11 refills | Status: DC

## 2018-04-10 MED ORDER — ATORVASTATIN CALCIUM 20 MG PO TABS: ORAL_TABLET | ORAL | 6 refills | Status: DC

## 2018-04-10 NOTE — Telephone Encounter (Addendum)
Patient called to ask about medications being filled like they had been before with 11 refills because they will not be able to get a primary physician until 03/2019 due to insurance. So I have sent in more refills so that they will have enough meds till last until they get a new PCP.

## 2018-04-11 DIAGNOSIS — H401131 Primary open-angle glaucoma, bilateral, mild stage: Secondary | ICD-10-CM | POA: Diagnosis not present

## 2018-04-11 DIAGNOSIS — H2513 Age-related nuclear cataract, bilateral: Secondary | ICD-10-CM | POA: Diagnosis not present

## 2018-05-04 ENCOUNTER — Ambulatory Visit (INDEPENDENT_AMBULATORY_CARE_PROVIDER_SITE_OTHER): Payer: Medicare Other

## 2018-05-04 DIAGNOSIS — Z23 Encounter for immunization: Secondary | ICD-10-CM | POA: Diagnosis not present

## 2018-05-11 ENCOUNTER — Other Ambulatory Visit: Payer: Self-pay | Admitting: *Deleted

## 2018-05-11 MED ORDER — OMEPRAZOLE 20 MG PO CPDR: 20.0000 mg | DELAYED_RELEASE_CAPSULE | Freq: Every day | ORAL | 11 refills | Status: DC

## 2018-06-20 ENCOUNTER — Other Ambulatory Visit: Payer: Self-pay | Admitting: Pulmonary Disease

## 2018-07-27 ENCOUNTER — Telehealth: Payer: Self-pay | Admitting: Pulmonary Disease

## 2018-07-27 NOTE — Telephone Encounter (Signed)
Tried to help patient on phone and advise of appointment tomorrow 1/10 at 130, but patients wife said that it was a question and she did not know what it was.  They are home and would like a call back.

## 2018-07-27 NOTE — Telephone Encounter (Signed)
Called the patient on both cell/home number left message to call back regarding new patient with Dr Nani Ravens on 07/27/2018.

## 2018-07-27 NOTE — Telephone Encounter (Signed)
Spoke to the patient and his wife. Informed that PCP had reviewed his chart and that he would likely not prescribe the Xanax and Restoril at the current dose and the he would eventually be weaning him down. The patient and his wife did very much appreciate this call before coming in for the appt. And that the MD took the time to review and let them know ahead of time. They agreed for now that it would be best to cancel the appointment and they would look for another provider that would be a better fit for him. I have canceled the appointment

## 2018-07-28 ENCOUNTER — Ambulatory Visit: Payer: Medicare Other | Admitting: Family Medicine

## 2018-09-20 ENCOUNTER — Other Ambulatory Visit: Payer: Self-pay | Admitting: Pulmonary Disease

## 2018-09-28 ENCOUNTER — Other Ambulatory Visit: Payer: Self-pay | Admitting: Pulmonary Disease

## 2018-11-01 ENCOUNTER — Telehealth: Payer: Self-pay

## 2018-11-01 ENCOUNTER — Telehealth: Payer: Self-pay | Admitting: Pulmonary Disease

## 2018-11-01 MED ORDER — TEMAZEPAM 30 MG PO CAPS: ORAL_CAPSULE | ORAL | 3 refills | Status: DC

## 2018-11-01 MED ORDER — NABUMETONE 750 MG PO TABS: 750.0000 mg | ORAL_TABLET | Freq: Two times a day (BID) | ORAL | 4 refills | Status: DC

## 2018-11-01 MED ORDER — METOPROLOL SUCCINATE ER 50 MG PO TB24: 50.0000 mg | ORAL_TABLET | Freq: Every day | ORAL | 4 refills | Status: DC

## 2018-11-01 NOTE — Telephone Encounter (Signed)
Called and spoke with Jerome Craig, Patient's Wife.  Jerome Craig stated Patient had a OV with new PCP, and the date was moved back due to Covid 19, protocol.  Patient's new OV  Aug. 13, 2020, with new PCP. Patient needed refills of Metoprolol, nabumetone, and temazepam.  Refills sent to preferred pharmacy.  Nothing further at this time.

## 2018-11-01 NOTE — Telephone Encounter (Signed)
Sure happy to see him

## 2018-11-01 NOTE — Telephone Encounter (Signed)
Pt appt has appt 03/01/19

## 2018-11-21 ENCOUNTER — Other Ambulatory Visit: Payer: Self-pay | Admitting: Pulmonary Disease

## 2019-03-01 ENCOUNTER — Telehealth: Payer: Self-pay

## 2019-03-01 ENCOUNTER — Ambulatory Visit: Payer: Medicare Other | Admitting: Family Medicine

## 2019-03-02 DIAGNOSIS — H353111 Nonexudative age-related macular degeneration, right eye, early dry stage: Secondary | ICD-10-CM | POA: Diagnosis not present

## 2019-03-02 DIAGNOSIS — H2513 Age-related nuclear cataract, bilateral: Secondary | ICD-10-CM | POA: Diagnosis not present

## 2019-03-02 DIAGNOSIS — H401131 Primary open-angle glaucoma, bilateral, mild stage: Secondary | ICD-10-CM | POA: Diagnosis not present

## 2019-03-06 ENCOUNTER — Other Ambulatory Visit: Payer: Self-pay

## 2019-03-06 ENCOUNTER — Ambulatory Visit (INDEPENDENT_AMBULATORY_CARE_PROVIDER_SITE_OTHER): Payer: Medicare Other | Admitting: Family Medicine

## 2019-03-06 ENCOUNTER — Encounter: Payer: Self-pay | Admitting: Family Medicine

## 2019-03-06 VITALS — BP 168/82 | HR 61 | Temp 96.9°F | Resp 18 | Wt 173.2 lb

## 2019-03-06 DIAGNOSIS — G4700 Insomnia, unspecified: Secondary | ICD-10-CM

## 2019-03-06 DIAGNOSIS — E7800 Pure hypercholesterolemia, unspecified: Secondary | ICD-10-CM

## 2019-03-06 DIAGNOSIS — H269 Unspecified cataract: Secondary | ICD-10-CM | POA: Diagnosis not present

## 2019-03-06 DIAGNOSIS — F411 Generalized anxiety disorder: Secondary | ICD-10-CM | POA: Diagnosis not present

## 2019-03-06 DIAGNOSIS — R351 Nocturia: Secondary | ICD-10-CM | POA: Diagnosis not present

## 2019-03-06 DIAGNOSIS — Z122 Encounter for screening for malignant neoplasm of respiratory organs: Secondary | ICD-10-CM

## 2019-03-06 DIAGNOSIS — Z87891 Personal history of nicotine dependence: Secondary | ICD-10-CM | POA: Diagnosis not present

## 2019-03-06 DIAGNOSIS — I10 Essential (primary) hypertension: Secondary | ICD-10-CM | POA: Diagnosis not present

## 2019-03-06 DIAGNOSIS — Z8619 Personal history of other infectious and parasitic diseases: Secondary | ICD-10-CM

## 2019-03-06 MED ORDER — METOPROLOL SUCCINATE ER 50 MG PO TB24: 50.0000 mg | ORAL_TABLET | Freq: Every day | ORAL | 4 refills | Status: DC

## 2019-03-06 MED ORDER — ATORVASTATIN CALCIUM 20 MG PO TABS: ORAL_TABLET | ORAL | 3 refills | Status: DC

## 2019-03-06 MED ORDER — ALPRAZOLAM 0.5 MG PO TABS: ORAL_TABLET | ORAL | 2 refills | Status: DC

## 2019-03-06 MED ORDER — OMEPRAZOLE 20 MG PO CPDR: 20.0000 mg | DELAYED_RELEASE_CAPSULE | Freq: Every day | ORAL | 11 refills | Status: DC

## 2019-03-06 MED ORDER — NABUMETONE 750 MG PO TABS: 750.0000 mg | ORAL_TABLET | Freq: Two times a day (BID) | ORAL | 4 refills | Status: DC

## 2019-03-06 MED ORDER — HYDROCHLOROTHIAZIDE 12.5 MG PO CAPS: 12.5000 mg | ORAL_CAPSULE | Freq: Every day | ORAL | 1 refills | Status: DC

## 2019-03-06 MED ORDER — TEMAZEPAM 30 MG PO CAPS: ORAL_CAPSULE | ORAL | 3 refills | Status: DC

## 2019-03-06 MED ORDER — CETIRIZINE HCL 10 MG PO TABS: 10.0000 mg | ORAL_TABLET | Freq: Every day | ORAL | 11 refills | Status: DC

## 2019-03-06 NOTE — Patient Instructions (Addendum)
Tylenol/Acetaminophen ES 500 mg tabs, 1-2 tabs up to 3 x a day ( max of 3000 mg/24 hours)  Try 1 tab po bid Hypertension, Adult High blood pressure (hypertension) is when the force of blood pumping through the arteries is too strong. The arteries are the blood vessels that carry blood from the heart throughout the body. Hypertension forces the heart to work harder to pump blood and may cause arteries to become narrow or stiff. Untreated or uncontrolled hypertension can cause a heart attack, heart failure, a stroke, kidney disease, and other problems. A blood pressure reading consists of a higher number over a lower number. Ideally, your blood pressure should be below 120/80. The first ("top") number is called the systolic pressure. It is a measure of the pressure in your arteries as your heart beats. The second ("bottom") number is called the diastolic pressure. It is a measure of the pressure in your arteries as the heart relaxes. What are the causes? The exact cause of this condition is not known. There are some conditions that result in or are related to high blood pressure. What increases the risk? Some risk factors for high blood pressure are under your control. The following factors may make you more likely to develop this condition:  Smoking.  Having type 2 diabetes mellitus, high cholesterol, or both.  Not getting enough exercise or physical activity.  Being overweight.  Having too much fat, sugar, calories, or salt (sodium) in your diet.  Drinking too much alcohol. Some risk factors for high blood pressure may be difficult or impossible to change. Some of these factors include:  Having chronic kidney disease.  Having a family history of high blood pressure.  Age. Risk increases with age.  Race. You may be at higher risk if you are African American.  Gender. Men are at higher risk than women before age 36. After age 59, women are at higher risk than men.  Having obstructive  sleep apnea.  Stress. What are the signs or symptoms? High blood pressure may not cause symptoms. Very high blood pressure (hypertensive crisis) may cause:  Headache.  Anxiety.  Shortness of breath.  Nosebleed.  Nausea and vomiting.  Vision changes.  Severe chest pain.  Seizures. How is this diagnosed? This condition is diagnosed by measuring your blood pressure while you are seated, with your arm resting on a flat surface, your legs uncrossed, and your feet flat on the floor. The cuff of the blood pressure monitor will be placed directly against the skin of your upper arm at the level of your heart. It should be measured at least twice using the same arm. Certain conditions can cause a difference in blood pressure between your right and left arms. Certain factors can cause blood pressure readings to be lower or higher than normal for a short period of time:  When your blood pressure is higher when you are in a health care provider's office than when you are at home, this is called white coat hypertension. Most people with this condition do not need medicines.  When your blood pressure is higher at home than when you are in a health care provider's office, this is called masked hypertension. Most people with this condition may need medicines to control blood pressure. If you have a high blood pressure reading during one visit or you have normal blood pressure with other risk factors, you may be asked to:  Return on a different day to have your blood pressure checked  again.  Monitor your blood pressure at home for 1 week or longer. If you are diagnosed with hypertension, you may have other blood or imaging tests to help your health care provider understand your overall risk for other conditions. How is this treated? This condition is treated by making healthy lifestyle changes, such as eating healthy foods, exercising more, and reducing your alcohol intake. Your health care provider  may prescribe medicine if lifestyle changes are not enough to get your blood pressure under control, and if:  Your systolic blood pressure is above 130.  Your diastolic blood pressure is above 80. Your personal target blood pressure may vary depending on your medical conditions, your age, and other factors. Follow these instructions at home: Eating and drinking   Eat a diet that is high in fiber and potassium, and low in sodium, added sugar, and fat. An example eating plan is called the DASH (Dietary Approaches to Stop Hypertension) diet. To eat this way: ? Eat plenty of fresh fruits and vegetables. Try to fill one half of your plate at each meal with fruits and vegetables. ? Eat whole grains, such as whole-wheat pasta, brown rice, or whole-grain bread. Fill about one fourth of your plate with whole grains. ? Eat or drink low-fat dairy products, such as skim milk or low-fat yogurt. ? Avoid fatty cuts of meat, processed or cured meats, and poultry with skin. Fill about one fourth of your plate with lean proteins, such as fish, chicken without skin, beans, eggs, or tofu. ? Avoid pre-made and processed foods. These tend to be higher in sodium, added sugar, and fat.  Reduce your daily sodium intake. Most people with hypertension should eat less than 1,500 mg of sodium a day.  Do not drink alcohol if: ? Your health care provider tells you not to drink. ? You are pregnant, may be pregnant, or are planning to become pregnant.  If you drink alcohol: ? Limit how much you use to:  0-1 drink a day for women.  0-2 drinks a day for men. ? Be aware of how much alcohol is in your drink. In the [place] one drink equals one 12 oz bottle of beer (355 mL), one 5 oz glass of wine (148 mL), or one 1 oz glass of hard liquor (44 mL). Lifestyle   Work with your health care provider to maintain a healthy body weight or to lose weight. Ask what an ideal weight is for you.  Get at least 30 minutes of exercise  most days of the week. Activities may include walking, swimming, or biking.  Include exercise to strengthen your muscles (resistance exercise), such as Pilates or lifting weights, as part of your weekly exercise routine. Try to do these types of exercises for 30 minutes at least 3 days a week.  Do not use any products that contain nicotine or tobacco, such as cigarettes, e-cigarettes, and chewing tobacco. If you need help quitting, ask your health care provider.  Monitor your blood pressure at home as told by your health care provider.  Keep all follow-up visits as told by your health care provider. This is important. Medicines  Take over-the-counter and prescription medicines only as told by your health care provider. Follow directions carefully. Blood pressure medicines must be taken as prescribed.  Do not skip doses of blood pressure medicine. Doing this puts you at risk for problems and can make the medicine less effective.  Ask your health care provider about side effects or reactions  to medicines that you should watch for. Contact a health care provider if you:  Think you are having a reaction to a medicine you are taking.  Have headaches that keep coming back (recurring).  Feel dizzy.  Have swelling in your ankles.  Have trouble with your vision. Get help right away if you:  Develop a severe headache or confusion.  Have unusual weakness or numbness.  Feel faint.  Have severe pain in your chest or abdomen.  Vomit repeatedly.  Have trouble breathing. Summary  Hypertension is when the force of blood pumping through your arteries is too strong. If this condition is not controlled, it may put you at risk for serious complications.  Your personal target blood pressure may vary depending on your medical conditions, your age, and other factors. For most people, a normal blood pressure is less than 120/80.  Hypertension is treated with lifestyle changes, medicines, or a  combination of both. Lifestyle changes include losing weight, eating a healthy, low-sodium diet, exercising more, and limiting alcohol. This information is not intended to replace advice given to you by your health care provider. Make sure you discuss any questions you have with your health care provider. Document Released: 07/05/2005 Document Revised: 03/15/2018 Document Reviewed: 03/15/2018 Elsevier Patient Education  2020 [employer].

## 2019-03-07 LAB — LIPID PANEL
Cholesterol: 178 mg/dL (ref 0–200)
HDL: 50.8 mg/dL (ref >39.00)
LDL Cholesterol: 105 mg/dL — ABNORMAL HIGH (ref 0–99)
NonHDL: 126.92
Total CHOL/HDL Ratio: 3
Triglycerides: 111 mg/dL (ref 0.0–149.0)
VLDL: 22.2 mg/dL (ref 0.0–40.0)

## 2019-03-07 LAB — CBC
HCT: 41.8 % (ref 39.0–52.0)
Hemoglobin: 14.4 g/dL (ref 13.0–17.0)
MCHC: 34.6 g/dL (ref 30.0–36.0)
MCV: 92.7 fl (ref 78.0–100.0)
Platelets: 177 10*3/uL (ref 150.0–400.0)
RBC: 4.5 Mil/uL (ref 4.22–5.81)
RDW: 13.1 % (ref 11.5–15.5)
WBC: 7.3 10*3/uL (ref 4.0–10.5)

## 2019-03-07 LAB — COMPREHENSIVE METABOLIC PANEL
ALT: 13 U/L (ref 0–53)
AST: 19 U/L (ref 0–37)
Albumin: 4.5 g/dL (ref 3.5–5.2)
Alkaline Phosphatase: 85 U/L (ref 39–117)
BUN: 21 mg/dL (ref 6–23)
CO2: 29 mEq/L (ref 19–32)
Calcium: 9.4 mg/dL (ref 8.4–10.5)
Chloride: 101 mEq/L (ref 96–112)
Creatinine, Ser: 1 mg/dL (ref 0.40–1.50)
GFR: 72.4 mL/min (ref >60.00)
Glucose, Bld: 89 mg/dL (ref 70–99)
Potassium: 5 mEq/L (ref 3.5–5.1)
Sodium: 138 mEq/L (ref 135–145)
Total Bilirubin: 1.2 mg/dL (ref 0.2–1.2)
Total Protein: 7.5 g/dL (ref 6.0–8.3)

## 2019-03-07 LAB — PSA: PSA: 1.62 ng/mL (ref 0.10–4.00)

## 2019-03-07 LAB — TSH: TSH: 0.69 u[IU]/mL (ref 0.35–4.50)

## 2019-03-08 ENCOUNTER — Encounter: Payer: Self-pay | Admitting: *Deleted

## 2019-03-09 NOTE — Telephone Encounter (Signed)
error 

## 2019-03-11 ENCOUNTER — Encounter: Payer: Self-pay | Admitting: Family Medicine

## 2019-03-11 DIAGNOSIS — Z8619 Personal history of other infectious and parasitic diseases: Secondary | ICD-10-CM | POA: Insufficient documentation

## 2019-03-11 DIAGNOSIS — H269 Unspecified cataract: Secondary | ICD-10-CM | POA: Insufficient documentation
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
## 2019-03-11 NOTE — Assessment & Plan Note (Signed)
Smoked 1.5 ppd, started at age 78 years and quit on June 18, 2011. He has been getting yearly chest xrays but will likely be a candidate for low dose CT scans and agrees to annual testing. He is referred.

## 2019-03-11 NOTE — Assessment & Plan Note (Signed)
Is having surgery in right eye soon.

## 2019-03-11 NOTE — Assessment & Plan Note (Signed)
Has used Alprazolam prn with good results no changes today

## 2019-03-11 NOTE — Assessment & Plan Note (Signed)
Encouraged good sleep hygiene such as dark, quiet room. No blue/green glowing lights such as computer screens in bedroom. No alcohol or stimulants in evening. Cut down on caffeine as able. Regular exercise is helpful but not just prior to bed time. Is long standing will not change medications at this time.

## 2019-03-11 NOTE — Assessment & Plan Note (Signed)
Tolerating statin, encouraged heart healthy diet, avoid trans fats, minimize simple carbs and saturated fats. Increase exercise as tolerated 

## 2019-03-12 NOTE — Addendum Note (Signed)
Addended by: Magdalene Molly A on: 03/12/2019 12:09 PM   Modules accepted: Orders

## 2019-03-14 NOTE — Addendum Note (Signed)
Addended by: Kem Boroughs D on: 03/14/2019 02:09 PM   Modules accepted: Orders

## 2019-03-20 ENCOUNTER — Ambulatory Visit (HOSPITAL_BASED_OUTPATIENT_CLINIC_OR_DEPARTMENT_OTHER)
Admission: RE | Admit: 2019-03-20 | Discharge: 2019-03-20 | Disposition: A | Discharge status: Home / Self Care | Payer: Medicare Other | Source: Ambulatory Visit | Attending: Family Medicine | Admitting: Family Medicine

## 2019-03-20 ENCOUNTER — Other Ambulatory Visit: Payer: Self-pay

## 2019-03-20 DIAGNOSIS — Z122 Encounter for screening for malignant neoplasm of respiratory organs: Secondary | ICD-10-CM | POA: Diagnosis not present

## 2019-03-20 DIAGNOSIS — Z87891 Personal history of nicotine dependence: Secondary | ICD-10-CM | POA: Insufficient documentation

## 2019-03-20 DIAGNOSIS — F17210 Nicotine dependence, cigarettes, uncomplicated: Secondary | ICD-10-CM | POA: Diagnosis not present

## 2019-04-12 ENCOUNTER — Ambulatory Visit: Payer: Medicare Other

## 2019-04-12 ENCOUNTER — Other Ambulatory Visit: Payer: Medicare Other

## 2019-04-12 DIAGNOSIS — H25811 Combined forms of age-related cataract, right eye: Secondary | ICD-10-CM | POA: Diagnosis not present

## 2019-04-12 DIAGNOSIS — H2511 Age-related nuclear cataract, right eye: Secondary | ICD-10-CM | POA: Diagnosis not present

## 2019-04-24 ENCOUNTER — Other Ambulatory Visit: Payer: Self-pay | Admitting: Emergency Medicine

## 2019-04-24 DIAGNOSIS — I10 Essential (primary) hypertension: Secondary | ICD-10-CM

## 2019-04-26 ENCOUNTER — Other Ambulatory Visit: Payer: Self-pay

## 2019-04-26 ENCOUNTER — Ambulatory Visit (INDEPENDENT_AMBULATORY_CARE_PROVIDER_SITE_OTHER): Payer: Medicare Other | Admitting: *Deleted

## 2019-04-26 ENCOUNTER — Other Ambulatory Visit (INDEPENDENT_AMBULATORY_CARE_PROVIDER_SITE_OTHER): Payer: Medicare Other

## 2019-04-26 DIAGNOSIS — I10 Essential (primary) hypertension: Secondary | ICD-10-CM | POA: Diagnosis not present

## 2019-04-26 DIAGNOSIS — Z23 Encounter for immunization: Secondary | ICD-10-CM | POA: Diagnosis not present

## 2019-04-26 LAB — COMPREHENSIVE METABOLIC PANEL
ALT: 11 U/L (ref 0–53)
AST: 16 U/L (ref 0–37)
Albumin: 4.5 g/dL (ref 3.5–5.2)
Alkaline Phosphatase: 101 U/L (ref 39–117)
BUN: 26 mg/dL — ABNORMAL HIGH (ref 6–23)
CO2: 29 mEq/L (ref 19–32)
Calcium: 9.6 mg/dL (ref 8.4–10.5)
Chloride: 100 mEq/L (ref 96–112)
Creatinine, Ser: 1.04 mg/dL (ref 0.40–1.50)
GFR: 69.18 mL/min (ref >60.00)
Glucose, Bld: 87 mg/dL (ref 70–99)
Potassium: 4.5 mEq/L (ref 3.5–5.1)
Sodium: 137 mEq/L (ref 135–145)
Total Bilirubin: 1.1 mg/dL (ref 0.2–1.2)
Total Protein: 7.3 g/dL (ref 6.0–8.3)

## 2019-04-26 NOTE — Progress Notes (Signed)
Patient here for flu vaccine.  Vaccine given in left deltoid and patient tolerated well. 

## 2019-05-10 DIAGNOSIS — H2512 Age-related nuclear cataract, left eye: Secondary | ICD-10-CM | POA: Diagnosis not present

## 2019-05-10 DIAGNOSIS — H25812 Combined forms of age-related cataract, left eye: Secondary | ICD-10-CM | POA: Diagnosis not present

## 2019-05-21 ENCOUNTER — Emergency Department (HOSPITAL_BASED_OUTPATIENT_CLINIC_OR_DEPARTMENT_OTHER)
Admission: EM | Admit: 2019-05-21 | Discharge: 2019-05-21 | Disposition: A | Discharge status: Home / Self Care | Payer: Medicare Other | Attending: Emergency Medicine | Admitting: Emergency Medicine

## 2019-05-21 ENCOUNTER — Other Ambulatory Visit: Payer: Self-pay

## 2019-05-21 ENCOUNTER — Encounter (HOSPITAL_BASED_OUTPATIENT_CLINIC_OR_DEPARTMENT_OTHER): Payer: Self-pay | Admitting: *Deleted

## 2019-05-21 DIAGNOSIS — Z79899 Other long term (current) drug therapy: Secondary | ICD-10-CM | POA: Diagnosis not present

## 2019-05-21 DIAGNOSIS — E7800 Pure hypercholesterolemia, unspecified: Secondary | ICD-10-CM | POA: Diagnosis not present

## 2019-05-21 DIAGNOSIS — Y939 Activity, unspecified: Secondary | ICD-10-CM | POA: Diagnosis not present

## 2019-05-21 DIAGNOSIS — Z87891 Personal history of nicotine dependence: Secondary | ICD-10-CM | POA: Diagnosis not present

## 2019-05-21 DIAGNOSIS — I10 Essential (primary) hypertension: Secondary | ICD-10-CM | POA: Diagnosis not present

## 2019-05-21 DIAGNOSIS — W228XXA Striking against or struck by other objects, initial encounter: Secondary | ICD-10-CM | POA: Insufficient documentation

## 2019-05-21 DIAGNOSIS — I2510 Atherosclerotic heart disease of native coronary artery without angina pectoris: Secondary | ICD-10-CM | POA: Diagnosis not present

## 2019-05-21 DIAGNOSIS — Y92008 Other place in unspecified non-institutional (private) residence as the place of occurrence of the external cause: Secondary | ICD-10-CM | POA: Insufficient documentation

## 2019-05-21 DIAGNOSIS — S91012A Laceration without foreign body, left ankle, initial encounter: Secondary | ICD-10-CM | POA: Diagnosis present

## 2019-05-21 DIAGNOSIS — Y999 Unspecified external cause status: Secondary | ICD-10-CM | POA: Diagnosis not present

## 2019-05-21 DIAGNOSIS — Z880 Allergy status to penicillin: Secondary | ICD-10-CM | POA: Insufficient documentation

## 2019-05-21 DIAGNOSIS — Z881 Allergy status to other antibiotic agents status: Secondary | ICD-10-CM | POA: Diagnosis not present

## 2019-05-21 DIAGNOSIS — Z5189 Encounter for other specified aftercare: Secondary | ICD-10-CM

## 2019-05-21 DIAGNOSIS — I83892 Varicose veins of left lower extremities with other complications: Secondary | ICD-10-CM | POA: Insufficient documentation

## 2019-05-21 DIAGNOSIS — S81802A Unspecified open wound, left lower leg, initial encounter: Secondary | ICD-10-CM | POA: Diagnosis not present

## 2019-05-21 DIAGNOSIS — J449 Chronic obstructive pulmonary disease, unspecified: Secondary | ICD-10-CM | POA: Diagnosis not present

## 2019-05-21 MED ORDER — SILVER NITRATE-POT NITRATE 75-25 % EX MISC
1.0000 | Freq: Once | CUTANEOUS | Status: AC
  Administered 2019-05-21: 1 via TOPICAL
  Filled 2019-05-21: qty 1

## 2019-05-21 NOTE — Discharge Instructions (Signed)
Keep combat gauze applied for 24 hours.  If it begins to bleed again, apply gauze again.  If bleeding is ongoing, please return to the emergency department for further evaluation and treatment.

## 2019-05-21 NOTE — ED Triage Notes (Signed)
2 days ago he was in his attic and his tennis shoe hit his left medial ankle causing it to bleed. He was able to control the bleeding with pressure. This am he removed the dressing and the bleeding started again. Bleeding, again is controlled with pressure.

## 2019-05-21 NOTE — ED Provider Notes (Signed)
Wound repair  Date/Time: 05/21/2019 7:26 PM Performed by: Margette Fast, MD Authorized by: Margette Fast, MD  Consent: Verbal consent obtained. Risks and benefits: risks, benefits and alternatives were discussed Consent given by: patient Local anesthesia used: no  Anesthesia: Local anesthesia used: no  Sedation: Patient sedated: no  Patient tolerance: patient tolerated the procedure well with no immediate complications Comments: Procedure performed for hemostasis. Patient with oozing noted to the ankle. Pressure applied to slow oozing and silver nitrate stick applied to the skin surface for cauterization. Wound became hemostatic without complication or severe pain. Dressing applied.        Margette Fast, MD 05/21/19 Kathyrn Drown
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
## 2019-08-08 ENCOUNTER — Other Ambulatory Visit: Payer: Self-pay | Admitting: Family Medicine

## 2019-08-09 NOTE — Telephone Encounter (Signed)
Requesting: xanax &  temazepam  Contract:no UDS: n/a/ Last OV: 03/06/19 Next OV: 09/06/19 Last Refill:03/06/19 Xanax #[id number] Temazepam #30-3rf Database:   Please advise

## 2019-08-28 ENCOUNTER — Other Ambulatory Visit: Payer: Self-pay | Admitting: Family Medicine

## 2019-09-05 ENCOUNTER — Telehealth: Payer: Self-pay | Admitting: Family Medicine

## 2019-09-05 NOTE — Telephone Encounter (Signed)
PT has visit scheduled for 2.18.21 so will get refill at that time/thx dmf

## 2019-09-05 NOTE — Telephone Encounter (Signed)
Medication: hydrochlorothiazide (MICROZIDE) 12.5 MG capsule  Has the patient contacted their pharmacy? Yes - no refills  - will be out 2/19  Preferred Pharmacy (with phone number or street name):  [place], [place] - [address] Phone:  [phone]  Fax:  [phone]

## 2019-09-06 ENCOUNTER — Ambulatory Visit (INDEPENDENT_AMBULATORY_CARE_PROVIDER_SITE_OTHER): Payer: Medicare Other | Admitting: Family Medicine

## 2019-09-06 ENCOUNTER — Other Ambulatory Visit: Payer: Self-pay

## 2019-09-06 VITALS — BP 141/83 | Wt 175.0 lb

## 2019-09-06 DIAGNOSIS — I2510 Atherosclerotic heart disease of native coronary artery without angina pectoris: Secondary | ICD-10-CM | POA: Diagnosis not present

## 2019-09-06 DIAGNOSIS — E7800 Pure hypercholesterolemia, unspecified: Secondary | ICD-10-CM | POA: Diagnosis not present

## 2019-09-06 DIAGNOSIS — I10 Essential (primary) hypertension: Secondary | ICD-10-CM

## 2019-09-06 DIAGNOSIS — M159 Polyosteoarthritis, unspecified: Secondary | ICD-10-CM

## 2019-09-06 DIAGNOSIS — R0789 Other chest pain: Secondary | ICD-10-CM

## 2019-09-06 DIAGNOSIS — M8949 Other hypertrophic osteoarthropathy, multiple sites: Secondary | ICD-10-CM | POA: Diagnosis not present

## 2019-09-06 MED ORDER — TEMAZEPAM 30 MG PO CAPS: ORAL_CAPSULE | ORAL | 5 refills | Status: DC

## 2019-09-06 MED ORDER — ATORVASTATIN CALCIUM 20 MG PO TABS: 10.0000 mg | ORAL_TABLET | ORAL | 3 refills | Status: DC

## 2019-09-06 MED ORDER — ALPRAZOLAM 0.5 MG PO TABS: ORAL_TABLET | ORAL | 5 refills | Status: DC

## 2019-09-06 MED ORDER — HYDROCHLOROTHIAZIDE 12.5 MG PO CAPS: 12.5000 mg | ORAL_CAPSULE | Freq: Every day | ORAL | 1 refills | Status: DC

## 2019-09-06 MED ORDER — METOPROLOL SUCCINATE ER 50 MG PO TB24: ORAL_TABLET | ORAL | 1 refills | Status: DC

## 2019-09-06 MED ORDER — NABUMETONE 750 MG PO TABS: 750.0000 mg | ORAL_TABLET | Freq: Every day | ORAL | 5 refills | Status: DC | PRN

## 2019-09-06 NOTE — Assessment & Plan Note (Signed)
He has been using Nabumetone bid for years but did cut it down to once a day. His pain is notably more. He will add Tylenol [phone] mg twice a day and we can consider switching off of Nabumetone in the future.
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
## 2019-09-06 NOTE — Assessment & Plan Note (Signed)
Tolerating statin, encouraged heart healthy diet, avoid trans fats, minimize simple carbs and saturated fats. Increase exercise as tolerated 

## 2019-09-06 NOTE — Assessment & Plan Note (Signed)
Has been doing well but had an episode of his chest hurting about six weeks ago. He describes it as feeling hot and painful on the left but it has not recurred and he had no associated symptoms. He is offered a cardiology consultation and he declines for now. Will proceed with an echo and he will report any concerns.

## 2019-09-06 NOTE — Assessment & Plan Note (Signed)
Well controlled, no changes to meds. Encouraged heart healthy diet such as the DASH diet and exercise as tolerated.  °

## 2019-09-06 NOTE — Progress Notes (Signed)
Last night BP was 136/76 Wants to discuss the statins, he only takes 10mg  of lipitor every other day  They also want to discuss the Nabumetone

## 2019-09-19 ENCOUNTER — Other Ambulatory Visit (INDEPENDENT_AMBULATORY_CARE_PROVIDER_SITE_OTHER): Payer: Medicare Other

## 2019-09-19 ENCOUNTER — Other Ambulatory Visit: Payer: Self-pay

## 2019-09-19 ENCOUNTER — Ambulatory Visit (HOSPITAL_BASED_OUTPATIENT_CLINIC_OR_DEPARTMENT_OTHER)
Admission: RE | Admit: 2019-09-19 | Discharge: 2019-09-19 | Disposition: A | Discharge status: Home / Self Care | Payer: Medicare Other | Source: Ambulatory Visit | Attending: Family Medicine | Admitting: Family Medicine

## 2019-09-19 ENCOUNTER — Telehealth: Payer: Self-pay | Admitting: Family Medicine

## 2019-09-19 DIAGNOSIS — I10 Essential (primary) hypertension: Secondary | ICD-10-CM

## 2019-09-19 DIAGNOSIS — E7800 Pure hypercholesterolemia, unspecified: Secondary | ICD-10-CM

## 2019-09-19 DIAGNOSIS — R0789 Other chest pain: Secondary | ICD-10-CM | POA: Diagnosis not present

## 2019-09-19 LAB — TSH: TSH: 1.05 u[IU]/mL (ref 0.35–4.50)

## 2019-09-19 LAB — LIPID PANEL
Cholesterol: 173 mg/dL (ref 0–200)
HDL: 49.9 mg/dL (ref >39.00)
LDL Cholesterol: 104 mg/dL — ABNORMAL HIGH (ref 0–99)
NonHDL: 122.99
Total CHOL/HDL Ratio: 3
Triglycerides: 95 mg/dL (ref 0.0–149.0)
VLDL: 19 mg/dL (ref 0.0–40.0)

## 2019-09-19 LAB — CBC
HCT: 40.6 % (ref 39.0–52.0)
Hemoglobin: 14.1 g/dL (ref 13.0–17.0)
MCHC: 34.7 g/dL (ref 30.0–36.0)
MCV: 93.2 fl (ref 78.0–100.0)
Platelets: 184 10*3/uL (ref 150.0–400.0)
RBC: 4.36 Mil/uL (ref 4.22–5.81)
RDW: 12.9 % (ref 11.5–15.5)
WBC: 7.3 10*3/uL (ref 4.0–10.5)

## 2019-09-19 LAB — COMPREHENSIVE METABOLIC PANEL
ALT: 12 U/L (ref 0–53)
AST: 20 U/L (ref 0–37)
Albumin: 4.4 g/dL (ref 3.5–5.2)
Alkaline Phosphatase: 88 U/L (ref 39–117)
BUN: 24 mg/dL — ABNORMAL HIGH (ref 6–23)
CO2: 30 mEq/L (ref 19–32)
Calcium: 9.6 mg/dL (ref 8.4–10.5)
Chloride: 99 mEq/L (ref 96–112)
Creatinine, Ser: 1.19 mg/dL (ref 0.40–1.50)
GFR: 59.15 mL/min — ABNORMAL LOW (ref >60.00)
Glucose, Bld: 95 mg/dL (ref 70–99)
Potassium: 4.7 mEq/L (ref 3.5–5.1)
Sodium: 137 mEq/L (ref 135–145)
Total Bilirubin: 1.4 mg/dL — ABNORMAL HIGH (ref 0.2–1.2)
Total Protein: 7.4 g/dL (ref 6.0–8.3)

## 2019-09-19 NOTE — Progress Notes (Signed)
  Echocardiogram 2D Echocardiogram has been performed.  Cardell Peach 09/19/2019, 2:46 PM

## 2019-09-19 NOTE — Telephone Encounter (Signed)
Pt dropped off copy of Covid-19 Vaccination record to be on pt's chart. Document put at front office tray under providers name.

## 2019-09-19 NOTE — Telephone Encounter (Signed)
Immunization updated in chart

## 2019-11-26 IMAGING — CT CT CHEST LUNG CANCER SCREENING LOW DOSE W/O CM
2 of 3 series · 15 of 36 positions shown, 18 images · non-contrast
Comparison: No priors.

CLINICAL DATA: 78-year-old male current smoker with 54 pack-year
history of smoking. Lung cancer screening examination.

EXAM:
CT CHEST WITHOUT CONTRAST LOW-DOSE FOR LUNG CANCER SCREENING
TECHNIQUE: Multidetector CT imaging of the chest was performed following the
standard protocol without IV contrast.

[Series 2: axial st · axial · 0.70mm/px · z∈[-344,-49]mm · 12 of 71 slices shown, 15 images]
[im 6/71  mediastinal]
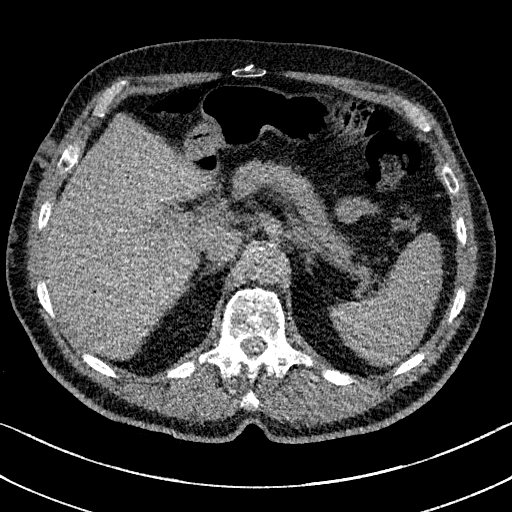
[im 6/71  lung]
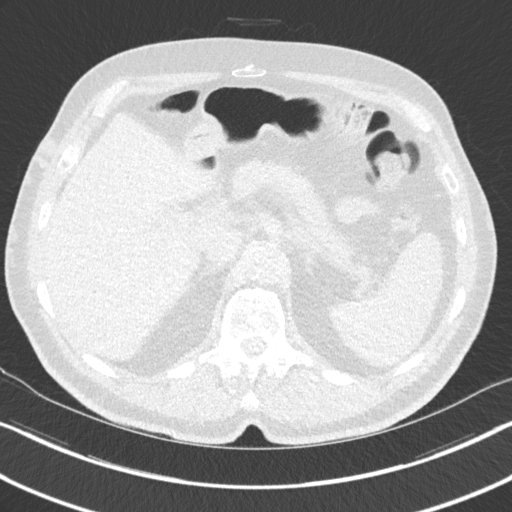
[im 11/71  lung]
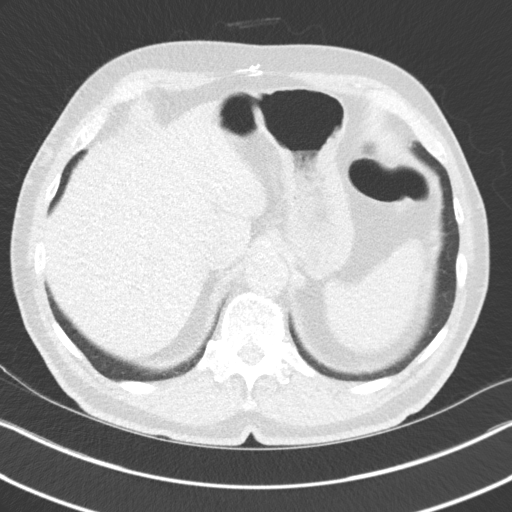
[im 16/71  lung]
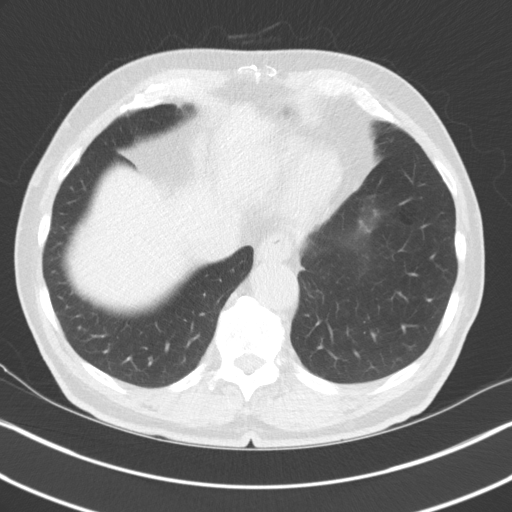
[im 21/71  lung]
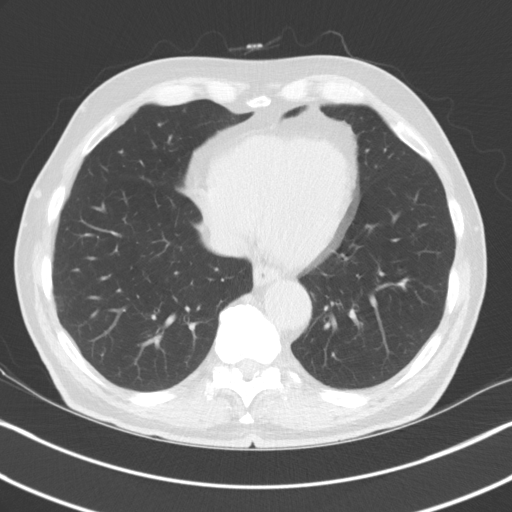
[im 26/71  mediastinal]
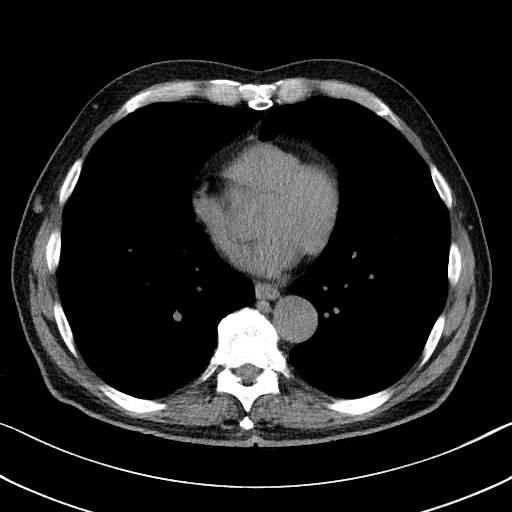
[im 26/71  lung]
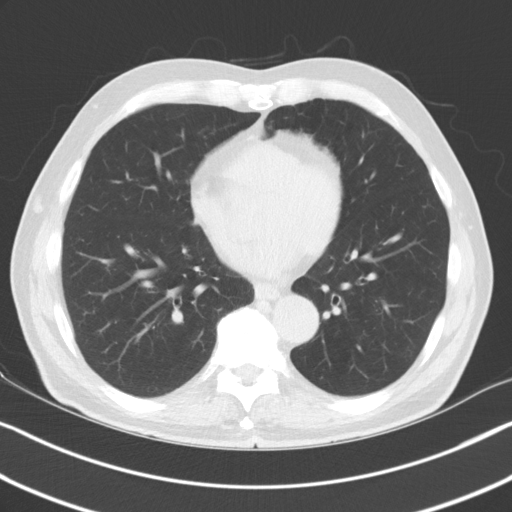
[im 32/71  lung]
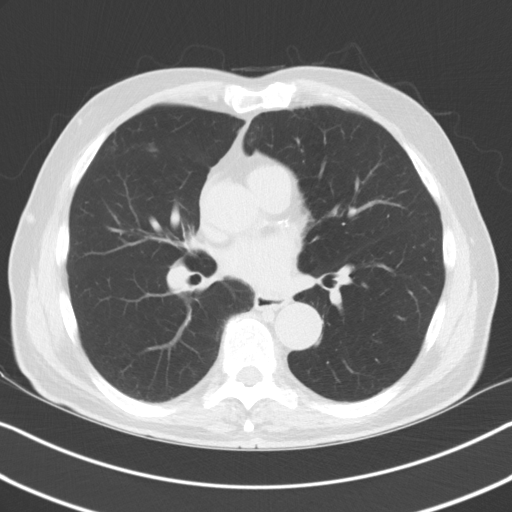
[im 39/71  lung]
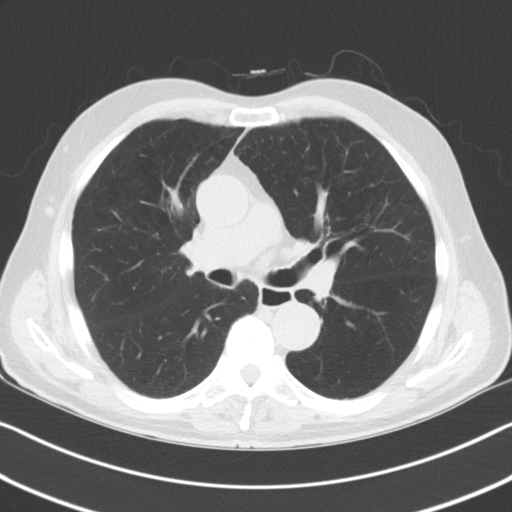
[im 45/71  lung]
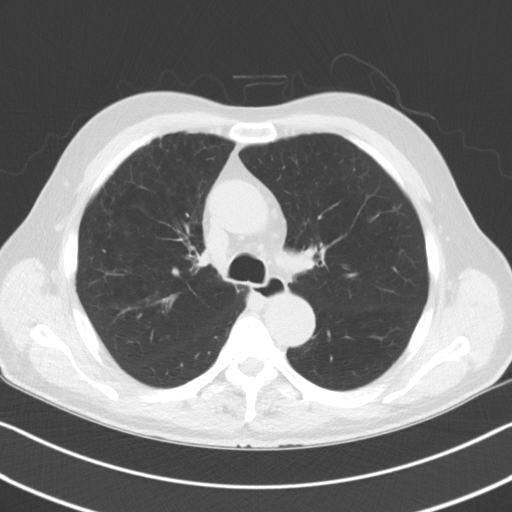
[im 50/71  mediastinal]
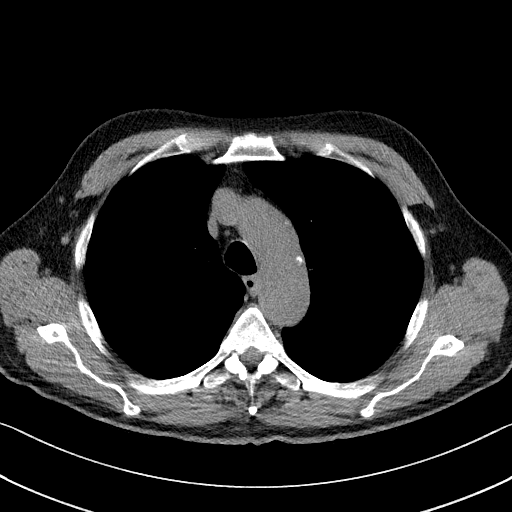
[im 50/71  lung]
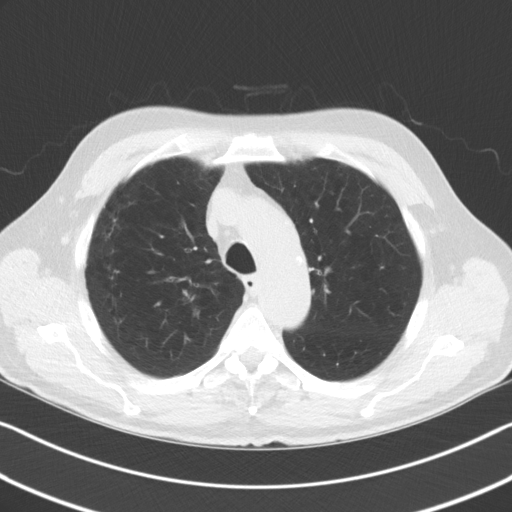
[im 55/71  lung]
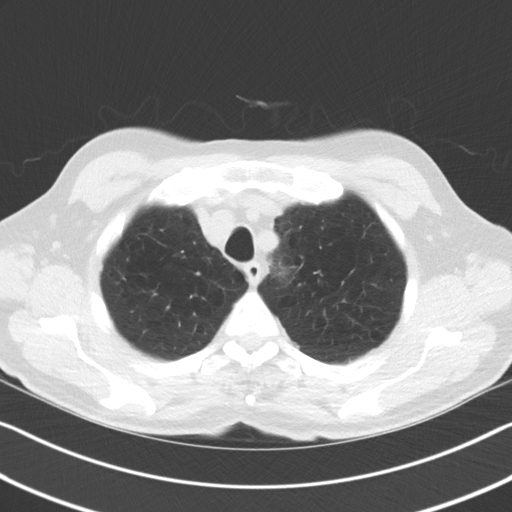
[im 60/71  lung]
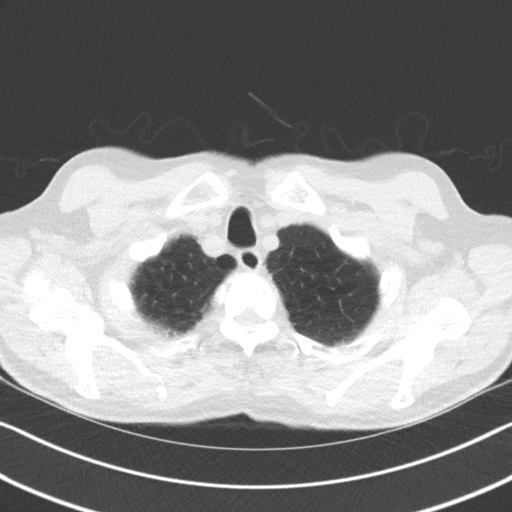
[im 65/71  lung]
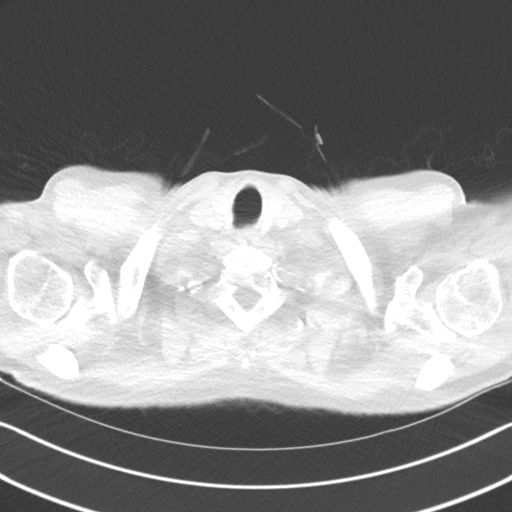

[Series 5: coronal · coronal · 0.69mm/px · 3 of 278 slices shown]
[im 56/278  lung]
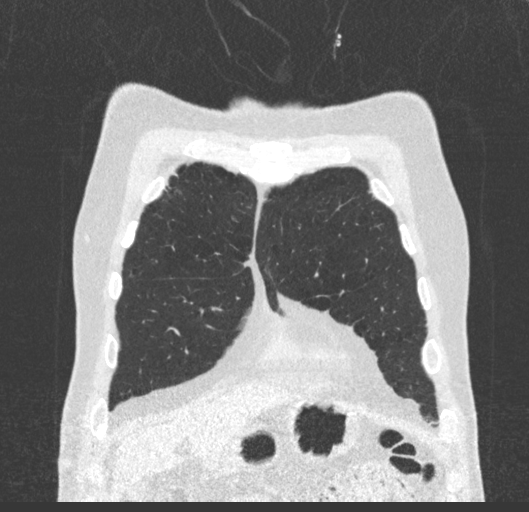
[im 111/278  lung]
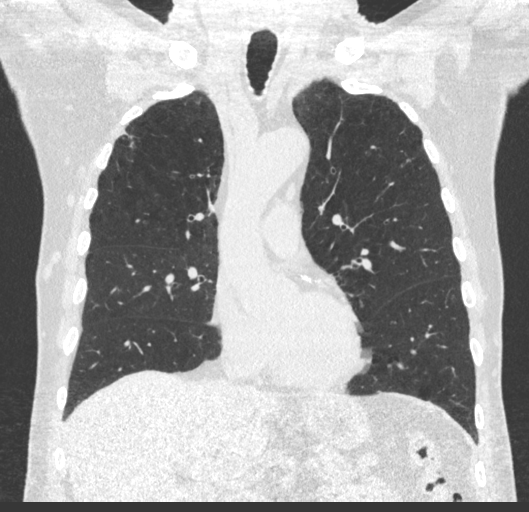
[im 167/278  lung]
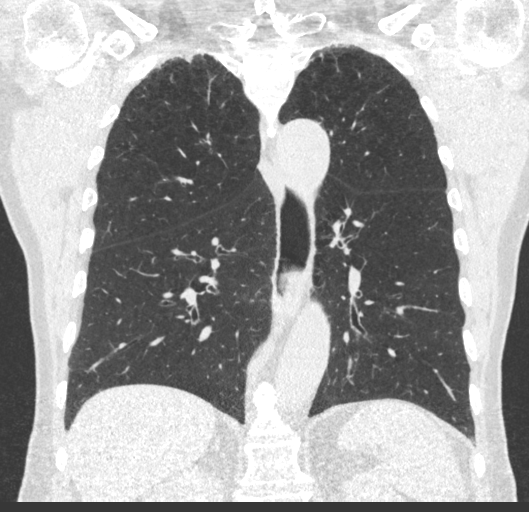

[15 of 36 positions shown; findings below may reference images not displayed]

FINDINGS: Cardiovascular: Heart size is normal. There is no significant
pericardial fluid, thickening or pericardial calcification. There is
aortic atherosclerosis, as well as atherosclerosis of the great
vessels of the mediastinum and the coronary arteries, including
calcified atherosclerotic plaque in the left main, left anterior
descending, left circumflex and right coronary arteries.

Mediastinum/Nodes: No pathologically enlarged mediastinal or hilar
lymph nodes. Please note that accurate exclusion of hilar adenopathy
is limited on noncontrast CT scans. Esophagus is unremarkable in
appearance. No axillary lymphadenopathy.

Lungs/Pleura: Multiple small pulmonary nodules are noted throughout
both lungs, largest of which is in the right upper lobe (axial image
91 of series 3), with a volume derived mean diameter of only 4.5 mm.
No larger more suspicious appearing pulmonary nodules or masses are
noted. No acute consolidative airspace disease. No pleural
effusions. Diffuse bronchial wall thickening with moderate
centrilobular and paraseptal emphysema.

Upper Abdomen: Aortic atherosclerosis.

Musculoskeletal: There are no aggressive appearing lytic or blastic
lesions noted in the visualized portions of the skeleton.
IMPRESSION: 1. Lung-RADS 2S, benign appearance or behavior. Continue annual
screening with low-dose chest CT without contrast in 12 months.
2. The "S" modifier above refers to potentially clinically
significant non lung cancer related findings. Specifically, there is
aortic atherosclerosis, in addition to left main and 3 vessel
coronary artery disease. Assessment for potential risk factor
modification, dietary therapy or pharmacologic therapy may be
warranted, if clinically indicated.
3. Mild diffuse bronchial wall thickening with moderate
centrilobular and paraseptal emphysema; imaging findings suggestive
of underlying COPD.

Aortic Atherosclerosis (RQCSZ-QI7.7) and Emphysema ([id number].X).

## 2020-01-18 DIAGNOSIS — H401131 Primary open-angle glaucoma, bilateral, mild stage: Secondary | ICD-10-CM | POA: Diagnosis not present

## 2020-01-18 DIAGNOSIS — H5340 Unspecified visual field defects: Secondary | ICD-10-CM | POA: Diagnosis not present

## 2020-03-03 ENCOUNTER — Other Ambulatory Visit: Payer: Self-pay | Admitting: Family Medicine

## 2020-03-03 NOTE — Telephone Encounter (Signed)
Last OV---09/06/2019 Last RF--09/06/2019---#30 with 5 refills

## 2020-03-10 ENCOUNTER — Other Ambulatory Visit: Payer: Self-pay

## 2020-03-10 ENCOUNTER — Encounter: Payer: Self-pay | Admitting: Family Medicine

## 2020-03-10 ENCOUNTER — Ambulatory Visit (INDEPENDENT_AMBULATORY_CARE_PROVIDER_SITE_OTHER): Payer: Medicare Other | Admitting: Family Medicine

## 2020-03-10 VITALS — BP 122/80 | HR 65 | Temp 97.8°F | Resp 12 | Ht 72.0 in | Wt 171.6 lb

## 2020-03-10 DIAGNOSIS — Z79899 Other long term (current) drug therapy: Secondary | ICD-10-CM

## 2020-03-10 DIAGNOSIS — R351 Nocturia: Secondary | ICD-10-CM

## 2020-03-10 DIAGNOSIS — G4700 Insomnia, unspecified: Secondary | ICD-10-CM

## 2020-03-10 DIAGNOSIS — F411 Generalized anxiety disorder: Secondary | ICD-10-CM

## 2020-03-10 DIAGNOSIS — I10 Essential (primary) hypertension: Secondary | ICD-10-CM

## 2020-03-10 DIAGNOSIS — Z0000 Encounter for general adult medical examination without abnormal findings: Secondary | ICD-10-CM

## 2020-03-10 DIAGNOSIS — Z87891 Personal history of nicotine dependence: Secondary | ICD-10-CM

## 2020-03-10 DIAGNOSIS — E7800 Pure hypercholesterolemia, unspecified: Secondary | ICD-10-CM

## 2020-03-10 MED ORDER — CETIRIZINE HCL 10 MG PO TABS: 10.0000 mg | ORAL_TABLET | Freq: Every day | ORAL | 1 refills | Status: DC

## 2020-03-10 MED ORDER — OMEPRAZOLE 20 MG PO CPDR: 20.0000 mg | DELAYED_RELEASE_CAPSULE | Freq: Every day | ORAL | 1 refills | Status: DC

## 2020-03-10 MED ORDER — HYDROCHLOROTHIAZIDE 12.5 MG PO CAPS: 12.5000 mg | ORAL_CAPSULE | Freq: Every day | ORAL | 1 refills | Status: DC

## 2020-03-10 MED ORDER — ALPRAZOLAM 0.5 MG PO TABS: ORAL_TABLET | ORAL | 5 refills | Status: DC

## 2020-03-10 MED ORDER — ATORVASTATIN CALCIUM 20 MG PO TABS: 10.0000 mg | ORAL_TABLET | ORAL | 1 refills | Status: DC

## 2020-03-10 MED ORDER — TEMAZEPAM 30 MG PO CAPS: 30.0000 mg | ORAL_CAPSULE | Freq: Every evening | ORAL | 5 refills | Status: DC | PRN

## 2020-03-10 MED ORDER — ZOSTER VAC RECOMB ADJUVANTED 50 MCG/0.5ML IM SUSR: 0.5000 mL | Freq: Once | INTRAMUSCULAR | 1 refills | Status: AC

## 2020-03-10 MED ORDER — NABUMETONE 750 MG PO TABS: 750.0000 mg | ORAL_TABLET | Freq: Every day | ORAL | 1 refills | Status: DC | PRN

## 2020-03-10 NOTE — Assessment & Plan Note (Signed)
Patient encouraged to maintain heart healthy diet, regular exercise, adequate sleep. Consider daily probiotics. Take medications as prescribed. Labs ordered and reviewed. Refills given

## 2020-03-10 NOTE — Assessment & Plan Note (Signed)
Asymptomatic but start low dose CT scan last year and agrees to proceed with further scans next month

## 2020-03-10 NOTE — Assessment & Plan Note (Signed)
Uses Alprazolam infrequently with good results. Refills given

## 2020-03-10 NOTE — Assessment & Plan Note (Signed)
Tolerating statin, encouraged heart healthy diet, avoid trans fats, minimize simple carbs and saturated fats. Increase exercise as tolerated. Only taking 1/2 tab po qod

## 2020-03-10 NOTE — Assessment & Plan Note (Signed)
Repeat EKG today. Well controlled, no changes to meds. Encouraged heart healthy diet such as the DASH diet and exercise as tolerated.
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
## 2020-03-10 NOTE — Patient Instructions (Signed)
Low dose CT scan of lungs last year was 04/08/2019. So needs repeat CT scan 04/08/2020 or later for surveillance if you have not heard anything by October let us know so we can help get it scheduled.   Preventive Care 35 Years and Older, Male Preventive care refers to lifestyle choices and visits with your health care provider that can promote health and wellness. This includes:  A yearly physical exam. This is also called an annual well check.  Regular dental and eye exams.  Immunizations.  Screening for certain conditions.  Healthy lifestyle choices, such as diet and exercise. What can I expect for my preventive care visit? Physical exam Your health care provider will check:  Height and weight. These may be used to calculate body mass index (BMI), which is a measurement that tells if you are at a healthy weight.  Heart rate and blood pressure.  Your skin for abnormal spots. Counseling Your health care provider may ask you questions about:  Alcohol, tobacco, and drug use.  Emotional well-being.  Home and relationship well-being.  Sexual activity.  Eating habits.  History of falls.  Memory and ability to understand (cognition).  Work and work Statistician. What immunizations do I need?  Influenza (flu) vaccine  This is recommended every year. Tetanus, diphtheria, and pertussis (Tdap) vaccine  You may need a Td booster every 10 years. Varicella (chickenpox) vaccine  You may need this vaccine if you have not already been vaccinated. Zoster (shingles) vaccine  You may need this after age 30. Pneumococcal conjugate (PCV13) vaccine  One dose is recommended after age 37. Pneumococcal polysaccharide (PPSV23) vaccine  One dose is recommended after age 6. Measles, mumps, and rubella (MMR) vaccine  You may need at least one dose of MMR if you were born in 1957 or later. You may also need a second dose. Meningococcal conjugate (MenACWY) vaccine  You may need  this if you have certain conditions. Hepatitis A vaccine  You may need this if you have certain conditions or if you travel or work in places where you may be exposed to hepatitis A. Hepatitis B vaccine  You may need this if you have certain conditions or if you travel or work in places where you may be exposed to hepatitis B. Haemophilus influenzae type b (Hib) vaccine  You may need this if you have certain conditions. You may receive vaccines as individual doses or as more than one vaccine together in one shot (combination vaccines). Talk with your health care provider about the risks and benefits of combination vaccines. What tests do I need? Blood tests  Lipid and cholesterol levels. These may be checked every 5 years, or more frequently depending on your overall health.  Hepatitis C test.  Hepatitis B test. Screening  Lung cancer screening. You may have this screening every year starting at age 29 if you have a 30-pack-year history of smoking and currently smoke or have quit within the past 15 years.  Colorectal cancer screening. All adults should have this screening starting at age 17 and continuing until age 67. Your health care provider may recommend screening at age 82 if you are at increased risk. You will have tests every 1-10 years, depending on your results and the type of screening test.  Prostate cancer screening. Recommendations will vary depending on your family history and other risks.  Diabetes screening. This is done by checking your blood sugar (glucose) after you have not eaten for a while (fasting). You may  have this done every 1-3 years.  Abdominal aortic aneurysm (AAA) screening. You may need this if you are a current or former smoker.  Sexually transmitted disease (STD) testing. Follow these instructions at home: Eating and drinking  Eat a diet that includes fresh fruits and vegetables, whole grains, lean protein, and low-fat dairy products. Limit your  intake of foods with high amounts of sugar, saturated fats, and salt.  Take vitamin and mineral supplements as recommended by your health care provider.  Do not drink alcohol if your health care provider tells you not to drink.  If you drink alcohol: ? Limit how much you have to 0-2 drinks a day. ? Be aware of how much alcohol is in your drink. In the [place] one drink equals one 12 oz bottle of beer (355 mL), one 5 oz glass of wine (148 mL), or one 1 oz glass of hard liquor (44 mL). Lifestyle  Take daily care of your teeth and gums.  Stay active. Exercise for at least 30 minutes on 5 or more days each week.  Do not use any products that contain nicotine or tobacco, such as cigarettes, e-cigarettes, and chewing tobacco. If you need help quitting, ask your health care provider.  If you are sexually active, practice safe sex. Use a condom or other form of protection to prevent STIs (sexually transmitted infections).  Talk with your health care provider about taking a low-dose aspirin or statin. What's next?  Visit your health care provider once a year for a well check visit.  Ask your health care provider how often you should have your eyes and teeth checked.  Stay up to date on all vaccines. This information is not intended to replace advice given to you by your health care provider. Make sure you discuss any questions you have with your health care provider. Document Revised: 06/29/2018 Document Reviewed: 06/29/2018 Elsevier Patient Education  2020 [employer].

## 2020-03-10 NOTE — Assessment & Plan Note (Signed)
Doing well on Temazepam. This has been a lifelong concern and is dong well at present

## 2020-03-11 ENCOUNTER — Encounter: Payer: Self-pay | Admitting: *Deleted

## 2020-03-11 LAB — LIPID PANEL
Cholesterol: 135 mg/dL (ref 0–200)
HDL: 40.5 mg/dL (ref >39.00)
LDL Cholesterol: 72 mg/dL (ref 0–99)
NonHDL: 94.65
Total CHOL/HDL Ratio: 3
Triglycerides: 111 mg/dL (ref 0.0–149.0)
VLDL: 22.2 mg/dL (ref 0.0–40.0)

## 2020-03-11 LAB — COMPREHENSIVE METABOLIC PANEL
ALT: 11 U/L (ref 0–53)
AST: 18 U/L (ref 0–37)
Albumin: 4.5 g/dL (ref 3.5–5.2)
Alkaline Phosphatase: 80 U/L (ref 39–117)
BUN: 23 mg/dL (ref 6–23)
CO2: 31 mEq/L (ref 19–32)
Calcium: 9.5 mg/dL (ref 8.4–10.5)
Chloride: 102 mEq/L (ref 96–112)
Creatinine, Ser: 1.07 mg/dL (ref 0.40–1.50)
GFR: 66.79 mL/min (ref >60.00)
Glucose, Bld: 84 mg/dL (ref 70–99)
Potassium: 4.6 mEq/L (ref 3.5–5.1)
Sodium: 141 mEq/L (ref 135–145)
Total Bilirubin: 1.1 mg/dL (ref 0.2–1.2)
Total Protein: 7.3 g/dL (ref 6.0–8.3)

## 2020-03-11 LAB — CBC
HCT: 40.4 % (ref 39.0–52.0)
Hemoglobin: 13.9 g/dL (ref 13.0–17.0)
MCHC: 34.3 g/dL (ref 30.0–36.0)
MCV: 92.3 fl (ref 78.0–100.0)
Platelets: 177 10*3/uL (ref 150.0–400.0)
RBC: 4.37 Mil/uL (ref 4.22–5.81)
RDW: 13.4 % (ref 11.5–15.5)
WBC: 6.6 10*3/uL (ref 4.0–10.5)

## 2020-03-11 LAB — TSH: TSH: 0.68 u[IU]/mL (ref 0.35–4.50)

## 2020-03-11 LAB — PSA: PSA: 1.67 ng/mL (ref 0.10–4.00)

## 2020-03-13 LAB — DRUG MONITORING, PANEL 8 WITH CONFIRMATION, URINE
6 Acetylmorphine: NEGATIVE ng/mL (ref <10)
Alcohol Metabolites: NEGATIVE ng/mL
Alphahydroxyalprazolam: 267 ng/mL — ABNORMAL HIGH (ref <25)
Alphahydroxymidazolam: NEGATIVE ng/mL (ref <50)
Alphahydroxytriazolam: NEGATIVE ng/mL (ref <50)
Aminoclonazepam: NEGATIVE ng/mL (ref <25)
Amphetamines: NEGATIVE ng/mL (ref <500)
Benzodiazepines: POSITIVE ng/mL — AB (ref <100)
Buprenorphine, Urine: NEGATIVE ng/mL (ref <5)
Cocaine Metabolite: NEGATIVE ng/mL (ref <150)
Creatinine: 144.8 mg/dL
Hydroxyethylflurazepam: NEGATIVE ng/mL (ref <50)
Lorazepam: NEGATIVE ng/mL (ref <50)
MDMA: NEGATIVE ng/mL (ref <500)
Marijuana Metabolite: NEGATIVE ng/mL (ref <20)
Nordiazepam: NEGATIVE ng/mL (ref <50)
Opiates: NEGATIVE ng/mL (ref <100)
Oxazepam: 4392 ng/mL — ABNORMAL HIGH (ref <50)
Oxidant: NEGATIVE ug/mL
Oxycodone: NEGATIVE ng/mL (ref <100)
Temazepam: >8000 ng/mL — ABNORMAL HIGH (ref <50)
pH: 5.5 (ref 4.5–9.0)

## 2020-03-13 LAB — DM TEMPLATE

## 2020-03-14 ENCOUNTER — Telehealth: Payer: Self-pay | Admitting: Family Medicine

## 2020-03-14 DIAGNOSIS — Z87891 Personal history of nicotine dependence: Secondary | ICD-10-CM

## 2020-03-14 NOTE — Addendum Note (Signed)
Addended by: Kem Boroughs D on: 03/14/2020 11:13 AM   Modules accepted: Orders

## 2020-03-14 NOTE — Telephone Encounter (Signed)
Caller Murray Hodgkins  Call Back # [phone] Cell Back :  [phone]   Patient's spouse states she would like Charlett Blake to order CT scan for patient .Patient would like to schedule Ct on 04/30/2020.

## 2020-03-20 ENCOUNTER — Other Ambulatory Visit: Payer: Self-pay

## 2020-03-20 ENCOUNTER — Ambulatory Visit (HOSPITAL_BASED_OUTPATIENT_CLINIC_OR_DEPARTMENT_OTHER)
Admission: RE | Admit: 2020-03-20 | Discharge: 2020-03-20 | Disposition: A | Discharge status: Home / Self Care | Payer: Medicare Other | Source: Ambulatory Visit | Attending: Family Medicine | Admitting: Family Medicine

## 2020-03-20 DIAGNOSIS — Z87891 Personal history of nicotine dependence: Secondary | ICD-10-CM | POA: Diagnosis not present

## 2020-03-20 DIAGNOSIS — I700 Atherosclerosis of aorta: Secondary | ICD-10-CM | POA: Insufficient documentation

## 2020-03-20 DIAGNOSIS — Z122 Encounter for screening for malignant neoplasm of respiratory organs: Secondary | ICD-10-CM | POA: Diagnosis not present

## 2020-03-20 DIAGNOSIS — J439 Emphysema, unspecified: Secondary | ICD-10-CM | POA: Insufficient documentation

## 2020-03-28 ENCOUNTER — Other Ambulatory Visit: Payer: Self-pay | Admitting: Family Medicine

## 2020-05-02 ENCOUNTER — Ambulatory Visit (INDEPENDENT_AMBULATORY_CARE_PROVIDER_SITE_OTHER): Payer: Medicare Other

## 2020-05-02 ENCOUNTER — Other Ambulatory Visit: Payer: Self-pay

## 2020-05-02 DIAGNOSIS — Z23 Encounter for immunization: Secondary | ICD-10-CM

## 2020-05-12 DIAGNOSIS — H401131 Primary open-angle glaucoma, bilateral, mild stage: Secondary | ICD-10-CM | POA: Diagnosis not present

## 2020-05-19 ENCOUNTER — Ambulatory Visit: Payer: Medicare Other

## 2020-06-10 ENCOUNTER — Other Ambulatory Visit: Payer: Self-pay | Admitting: Family Medicine

## 2020-06-19 ENCOUNTER — Telehealth: Payer: Self-pay | Admitting: Family Medicine

## 2020-06-19 NOTE — Progress Notes (Signed)
  Chronic Care Management   Note  06/19/2020 Name: Jerome Craig MRN: [id number] DOB: 12-29-1940  Jerome Craig is a 79 y.o. year old male who is a primary care patient of [name], MD. I reached out to [employer] by phone today in response to a referral sent by Jerome Craig PCP, [name], MD.   Jerome Craig was given information about Chronic Care Management services today including:  1. CCM service includes personalized support from designated clinical staff supervised by his physician, including individualized plan of care and coordination with other care providers 2. 24/7 contact phone numbers for assistance for urgent and routine care needs. 3. Service will only be billed when office clinical staff spend 20 minutes or more in a month to coordinate care. 4. Only one practitioner may furnish and bill the service in a calendar month. 5. The patient may stop CCM services at any time (effective at the end of the month) by phone call to the office staff.   Patient wishes to consider information provided and/or speak with a member of the care team before deciding about enrollment in care management services.   Follow up plan:   Carley Perdue [employer] Scheduler

## 2020-06-19 NOTE — Progress Notes (Signed)
  Chronic Care Management   Outreach Note  06/19/2020 Name: Jerome Craig MRN: [id number] DOB: 05-30-1941  Referred by: [name], MD Reason for referral : No chief complaint on file.   An unsuccessful telephone outreach was attempted today. The patient was referred to the pharmacist for assistance with care management and care coordination.   Follow Up Plan:   Carley Perdue [employer] Scheduler

## 2020-09-11 ENCOUNTER — Other Ambulatory Visit: Payer: Self-pay

## 2020-09-11 ENCOUNTER — Encounter: Payer: Self-pay | Admitting: Family Medicine

## 2020-09-11 ENCOUNTER — Ambulatory Visit (INDEPENDENT_AMBULATORY_CARE_PROVIDER_SITE_OTHER): Payer: Medicare Other | Admitting: Family Medicine

## 2020-09-11 VITALS — BP 142/82 | HR 89 | Temp 97.5°F | Resp 16 | Wt 171.4 lb

## 2020-09-11 DIAGNOSIS — F411 Generalized anxiety disorder: Secondary | ICD-10-CM | POA: Diagnosis not present

## 2020-09-11 DIAGNOSIS — Z7289 Other problems related to lifestyle: Secondary | ICD-10-CM | POA: Diagnosis not present

## 2020-09-11 DIAGNOSIS — I10 Essential (primary) hypertension: Secondary | ICD-10-CM

## 2020-09-11 DIAGNOSIS — Z79899 Other long term (current) drug therapy: Secondary | ICD-10-CM | POA: Diagnosis not present

## 2020-09-11 DIAGNOSIS — G4700 Insomnia, unspecified: Secondary | ICD-10-CM

## 2020-09-11 DIAGNOSIS — E7800 Pure hypercholesterolemia, unspecified: Secondary | ICD-10-CM

## 2020-09-11 MED ORDER — METOPROLOL SUCCINATE ER 50 MG PO TB24: ORAL_TABLET | ORAL | 1 refills | Status: DC

## 2020-09-11 MED ORDER — ALPRAZOLAM 0.5 MG PO TABS: ORAL_TABLET | ORAL | 5 refills | Status: DC

## 2020-09-11 MED ORDER — OMEPRAZOLE 20 MG PO CPDR: 20.0000 mg | DELAYED_RELEASE_CAPSULE | Freq: Every day | ORAL | 1 refills | Status: DC

## 2020-09-11 MED ORDER — TEMAZEPAM 30 MG PO CAPS
30.0000 mg | ORAL_CAPSULE | Freq: Every evening | ORAL | 5 refills | Status: DC | PRN
Start: 2020-09-11 — End: 2021-04-02

## 2020-09-11 MED ORDER — CETIRIZINE HCL 10 MG PO TABS: 10.0000 mg | ORAL_TABLET | Freq: Every day | ORAL | 1 refills | Status: DC

## 2020-09-11 MED ORDER — ATORVASTATIN CALCIUM 10 MG PO TABS: 5.0000 mg | ORAL_TABLET | Freq: Every day | ORAL | 1 refills | Status: DC

## 2020-09-11 MED ORDER — HYDROCHLOROTHIAZIDE 12.5 MG PO CAPS: 12.5000 mg | ORAL_CAPSULE | Freq: Every day | ORAL | 1 refills | Status: DC

## 2020-09-11 NOTE — Assessment & Plan Note (Signed)
Well controlled, no changes to meds. Encouraged heart healthy diet such as the DASH diet and exercise as tolerated.  °

## 2020-09-11 NOTE — Patient Instructions (Addendum)
Want home resting bp<140 over 90   Hypertension, Adult High blood pressure (hypertension) is when the force of blood pumping through the arteries is too strong. The arteries are the blood vessels that carry blood from the heart throughout the body. Hypertension forces the heart to work harder to pump blood and may cause arteries to become narrow or stiff. Untreated or uncontrolled hypertension can cause a heart attack, heart failure, a stroke, kidney disease, and other problems. A blood pressure reading consists of a higher number over a lower number. Ideally, your blood pressure should be below 120/80. The first ("top") number is called the systolic pressure. It is a measure of the pressure in your arteries as your heart beats. The second ("bottom") number is called the diastolic pressure. It is a measure of the pressure in your arteries as the heart relaxes. What are the causes? The exact cause of this condition is not known. There are some conditions that result in or are related to high blood pressure. What increases the risk? Some risk factors for high blood pressure are under your control. The following factors may make you more likely to develop this condition:  Smoking.  Having type 2 diabetes mellitus, high cholesterol, or both.  Not getting enough exercise or physical activity.  Being overweight.  Having too much fat, sugar, calories, or salt (sodium) in your diet.  Drinking too much alcohol. Some risk factors for high blood pressure may be difficult or impossible to change. Some of these factors include:  Having chronic kidney disease.  Having a family history of high blood pressure.  Age. Risk increases with age.  Race. You may be at higher risk if you are African American.  Gender. Men are at higher risk than women before age 73. After age 46, women are at higher risk than men.  Having obstructive sleep apnea.  Stress. What are the signs or symptoms? High blood  pressure may not cause symptoms. Very high blood pressure (hypertensive crisis) may cause:  Headache.  Anxiety.  Shortness of breath.  Nosebleed.  Nausea and vomiting.  Vision changes.  Severe chest pain.  Seizures. How is this diagnosed? This condition is diagnosed by measuring your blood pressure while you are seated, with your arm resting on a flat surface, your legs uncrossed, and your feet flat on the floor. The cuff of the blood pressure monitor will be placed directly against the skin of your upper arm at the level of your heart. It should be measured at least twice using the same arm. Certain conditions can cause a difference in blood pressure between your right and left arms. Certain factors can cause blood pressure readings to be lower or higher than normal for a short period of time:  When your blood pressure is higher when you are in a health care provider's office than when you are at home, this is called white coat hypertension. Most people with this condition do not need medicines.  When your blood pressure is higher at home than when you are in a health care provider's office, this is called masked hypertension. Most people with this condition may need medicines to control blood pressure. If you have a high blood pressure reading during one visit or you have normal blood pressure with other risk factors, you may be asked to:  Return on a different day to have your blood pressure checked again.  Monitor your blood pressure at home for 1 week or longer. If you are diagnosed  with hypertension, you may have other blood or imaging tests to help your health care provider understand your overall risk for other conditions. How is this treated? This condition is treated by making healthy lifestyle changes, such as eating healthy foods, exercising more, and reducing your alcohol intake. Your health care provider may prescribe medicine if lifestyle changes are not enough to get  your blood pressure under control, and if:  Your systolic blood pressure is above 130.  Your diastolic blood pressure is above 80. Your personal target blood pressure may vary depending on your medical conditions, your age, and other factors. Follow these instructions at home: Eating and drinking  Eat a diet that is high in fiber and potassium, and low in sodium, added sugar, and fat. An example eating plan is called the DASH (Dietary Approaches to Stop Hypertension) diet. To eat this way: ? Eat plenty of fresh fruits and vegetables. Try to fill one half of your plate at each meal with fruits and vegetables. ? Eat whole grains, such as whole-wheat pasta, brown rice, or whole-grain bread. Fill about one fourth of your plate with whole grains. ? Eat or drink low-fat dairy products, such as skim milk or low-fat yogurt. ? Avoid fatty cuts of meat, processed or cured meats, and poultry with skin. Fill about one fourth of your plate with lean proteins, such as fish, chicken without skin, beans, eggs, or tofu. ? Avoid pre-made and processed foods. These tend to be higher in sodium, added sugar, and fat.  Reduce your daily sodium intake. Most people with hypertension should eat less than 1,500 mg of sodium a day.  Do not drink alcohol if: ? Your health care provider tells you not to drink. ? You are pregnant, may be pregnant, or are planning to become pregnant.  If you drink alcohol: ? Limit how much you use to:  0-1 drink a day for women.  0-2 drinks a day for men. ? Be aware of how much alcohol is in your drink. In the [place] one drink equals one 12 oz bottle of beer (355 mL), one 5 oz glass of wine (148 mL), or one 1 oz glass of hard liquor (44 mL).   Lifestyle  Work with your health care provider to maintain a healthy body weight or to lose weight. Ask what an ideal weight is for you.  Get at least 30 minutes of exercise most days of the week. Activities may include walking, swimming, or  biking.  Include exercise to strengthen your muscles (resistance exercise), such as Pilates or lifting weights, as part of your weekly exercise routine. Try to do these types of exercises for 30 minutes at least 3 days a week.  Do not use any products that contain nicotine or tobacco, such as cigarettes, e-cigarettes, and chewing tobacco. If you need help quitting, ask your health care provider.  Monitor your blood pressure at home as told by your health care provider.  Keep all follow-up visits as told by your health care provider. This is important.   Medicines  Take over-the-counter and prescription medicines only as told by your health care provider. Follow directions carefully. Blood pressure medicines must be taken as prescribed.  Do not skip doses of blood pressure medicine. Doing this puts you at risk for problems and can make the medicine less effective.  Ask your health care provider about side effects or reactions to medicines that you should watch for. Contact a health care provider if you:  Think you are having a reaction to a medicine you are taking.  Have headaches that keep coming back (recurring).  Feel dizzy.  Have swelling in your ankles.  Have trouble with your vision. Get help right away if you:  Develop a severe headache or confusion.  Have unusual weakness or numbness.  Feel faint.  Have severe pain in your chest or abdomen.  Vomit repeatedly.  Have trouble breathing. Summary  Hypertension is when the force of blood pumping through your arteries is too strong. If this condition is not controlled, it may put you at risk for serious complications.  Your personal target blood pressure may vary depending on your medical conditions, your age, and other factors. For most people, a normal blood pressure is less than 120/80.  Hypertension is treated with lifestyle changes, medicines, or a combination of both. Lifestyle changes include losing weight, eating  a healthy, low-sodium diet, exercising more, and limiting alcohol. This information is not intended to replace advice given to you by your health care provider. Make sure you discuss any questions you have with your health care provider. Document Revised: 03/15/2018 Document Reviewed: 03/15/2018 Elsevier Patient Education  2021 [employer].

## 2020-09-12 LAB — COMPREHENSIVE METABOLIC PANEL
ALT: 11 U/L (ref 0–53)
AST: 19 U/L (ref 0–37)
Albumin: 4.3 g/dL (ref 3.5–5.2)
Alkaline Phosphatase: 84 U/L (ref 39–117)
BUN: 20 mg/dL (ref 6–23)
CO2: 32 mEq/L (ref 19–32)
Calcium: 9.4 mg/dL (ref 8.4–10.5)
Chloride: 101 mEq/L (ref 96–112)
Creatinine, Ser: 1.07 mg/dL (ref 0.40–1.50)
GFR: 66.33 mL/min (ref >60.00)
Glucose, Bld: 84 mg/dL (ref 70–99)
Potassium: 4.6 mEq/L (ref 3.5–5.1)
Sodium: 140 mEq/L (ref 135–145)
Total Bilirubin: 1.1 mg/dL (ref 0.2–1.2)
Total Protein: 7.3 g/dL (ref 6.0–8.3)

## 2020-09-12 LAB — LIPID PANEL
Cholesterol: 167 mg/dL (ref 0–200)
HDL: 49.8 mg/dL (ref >39.00)
LDL Cholesterol: 98 mg/dL (ref 0–99)
NonHDL: 117.51
Total CHOL/HDL Ratio: 3
Triglycerides: 100 mg/dL (ref 0.0–149.0)
VLDL: 20 mg/dL (ref 0.0–40.0)

## 2020-09-12 LAB — CBC
HCT: 40.7 % (ref 39.0–52.0)
Hemoglobin: 13.7 g/dL (ref 13.0–17.0)
MCHC: 33.8 g/dL (ref 30.0–36.0)
MCV: 92.4 fl (ref 78.0–100.0)
Platelets: 174 10*3/uL (ref 150.0–400.0)
RBC: 4.4 Mil/uL (ref 4.22–5.81)
RDW: 13.7 % (ref 11.5–15.5)
WBC: 7.4 10*3/uL (ref 4.0–10.5)

## 2020-09-12 LAB — TSH: TSH: 0.54 u[IU]/mL (ref 0.35–4.50)
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
## 2020-09-14 NOTE — Assessment & Plan Note (Signed)
Encouraged good sleep hygiene such as dark, quiet room. No blue/green glowing lights such as computer screens in bedroom. No alcohol or stimulants in evening. Cut down on caffeine as able. Regular exercise is helpful but not just prior to bed time. Temazepam is refilled.

## 2020-09-14 NOTE — Assessment & Plan Note (Signed)
He is doing well but still using Alprazolam prn with good results and no side effects. Refill given today. UDS obtained

## 2020-09-14 NOTE — Assessment & Plan Note (Signed)
Encouraged heart healthy diet, increase exercise, avoid trans fats, consider a krill oil cap daily. Tolerating Atorvastatin 

## 2020-09-15 LAB — DRUG MONITORING, PANEL 8 WITH CONFIRMATION, URINE
6 Acetylmorphine: NEGATIVE ng/mL (ref <10)
Alcohol Metabolites: NEGATIVE ng/mL
Alphahydroxyalprazolam: 365 ng/mL — ABNORMAL HIGH (ref <25)
Alphahydroxymidazolam: NEGATIVE ng/mL (ref <50)
Alphahydroxytriazolam: NEGATIVE ng/mL (ref <50)
Aminoclonazepam: NEGATIVE ng/mL (ref <25)
Amphetamines: NEGATIVE ng/mL (ref <500)
Benzodiazepines: POSITIVE ng/mL — AB (ref <100)
Buprenorphine, Urine: NEGATIVE ng/mL (ref <5)
Cocaine Metabolite: NEGATIVE ng/mL (ref <150)
Creatinine: 164.5 mg/dL
Hydroxyethylflurazepam: NEGATIVE ng/mL (ref <50)
Lorazepam: NEGATIVE ng/mL (ref <50)
MDMA: NEGATIVE ng/mL (ref <500)
Marijuana Metabolite: NEGATIVE ng/mL (ref <20)
Nordiazepam: NEGATIVE ng/mL (ref <50)
Opiates: NEGATIVE ng/mL (ref <100)
Oxazepam: >8000 ng/mL — ABNORMAL HIGH (ref <50)
Oxidant: NEGATIVE ug/mL
Oxycodone: NEGATIVE ng/mL (ref <100)
Temazepam: >8000 ng/mL — ABNORMAL HIGH (ref <50)
pH: 5.7 (ref 4.5–9.0)

## 2020-09-15 LAB — DM TEMPLATE

## 2020-09-15 LAB — HEPATITIS C ANTIBODY

## 2020-11-06 ENCOUNTER — Other Ambulatory Visit: Payer: Self-pay | Admitting: Family Medicine

## 2020-11-26 IMAGING — CT CT CHEST LUNG CANCER SCREENING LOW DOSE W/O CM
3 of 4 series · 14 of 36 positions shown, 15 images · non-contrast
Comparison: 03/20/2019

CLINICAL DATA: Fifty-four pack-year smoking history. Ex-smoker,
quitting 9 years ago.

EXAM:
CT CHEST WITHOUT CONTRAST LOW-DOSE FOR LUNG CANCER SCREENING
TECHNIQUE: Multidetector CT imaging of the chest was performed following the
standard protocol without IV contrast.

[Series 2: axial st · axial · 0.76mm/px · z∈[+1078,+1238]mm · 3 of 66 slices shown, 4 images]
[im 17/66  mediastinal]
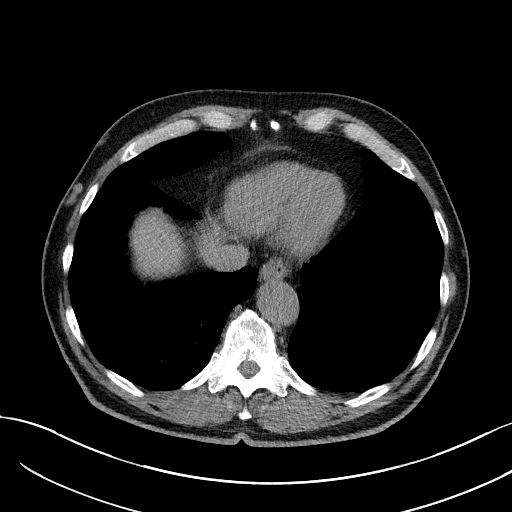
[im 17/66  lung]
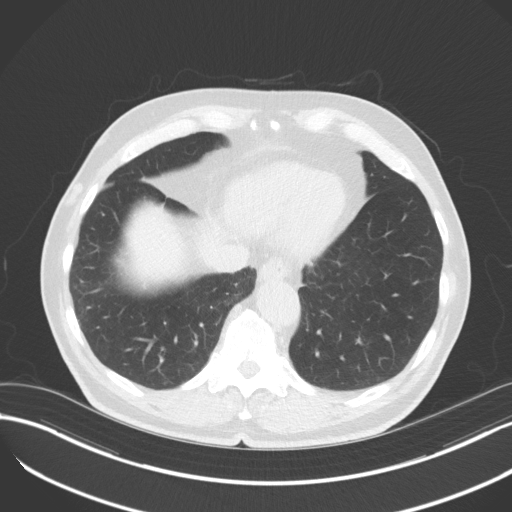
[im 33/66  lung]
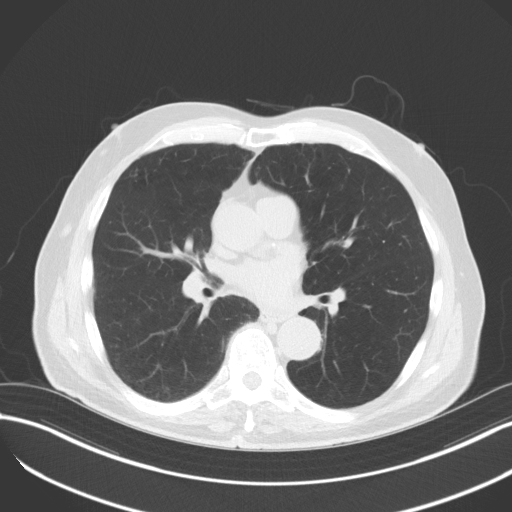
[im 49/66  lung]
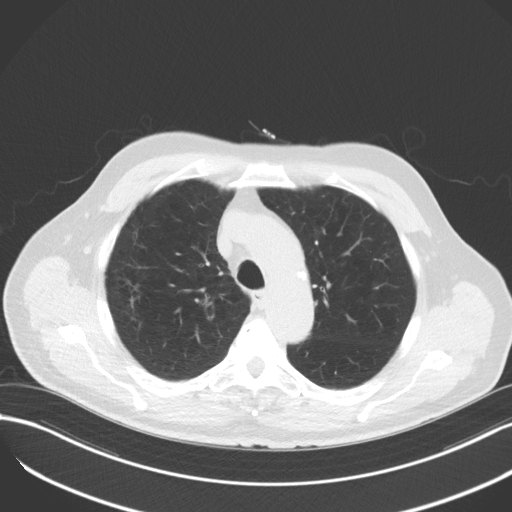

[Series 3: lungs · axial · 0.89mm/px · z∈[+1053,+1296]mm · 8 of 297 slices shown]
[im 27/297  lung]
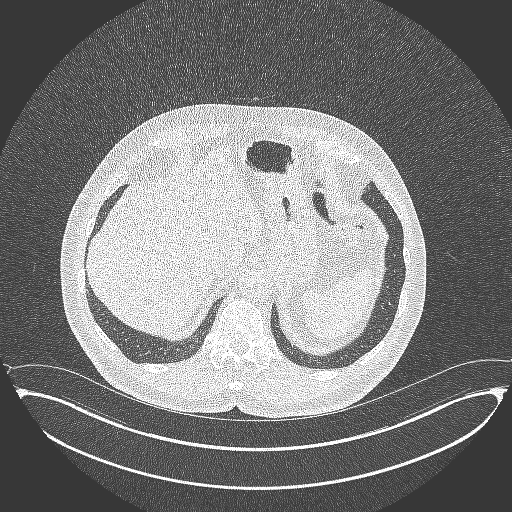
[im 54/297  lung]
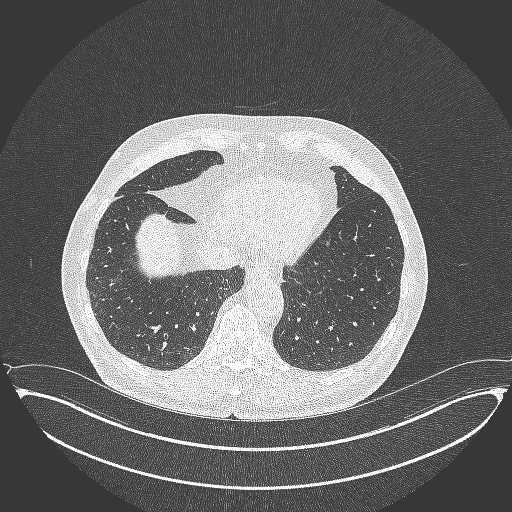
[im 95/297  lung]
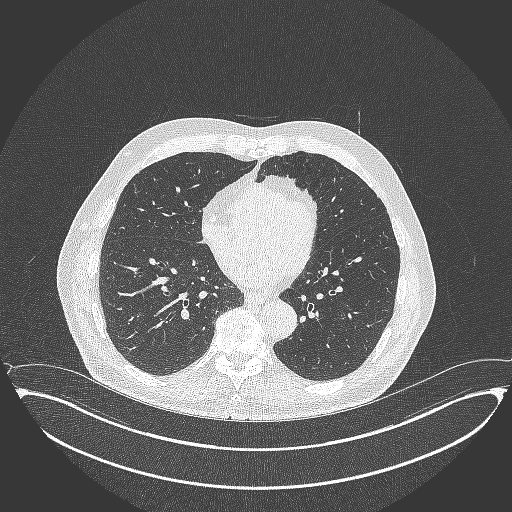
[im 135/297  lung]
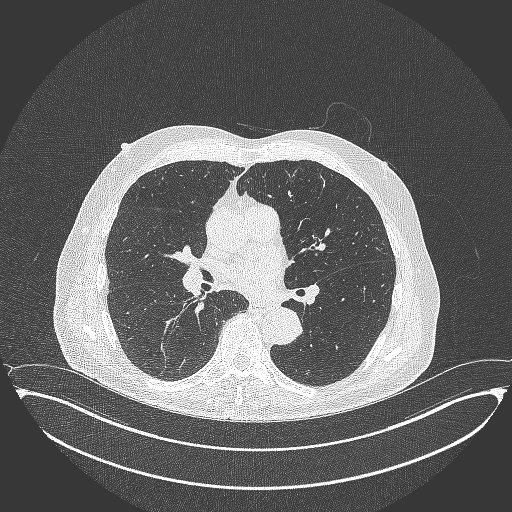
[im 162/297  lung]
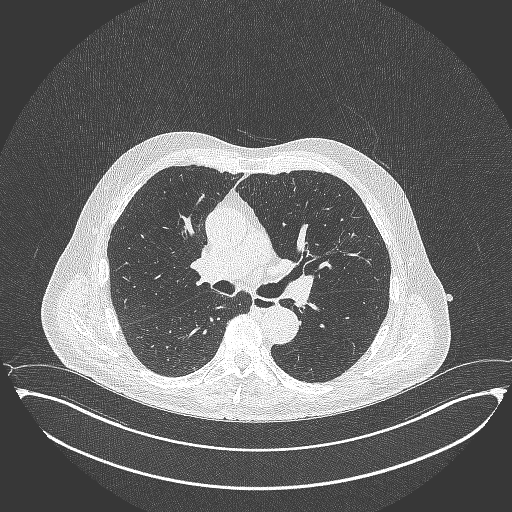
[im 202/297  lung]
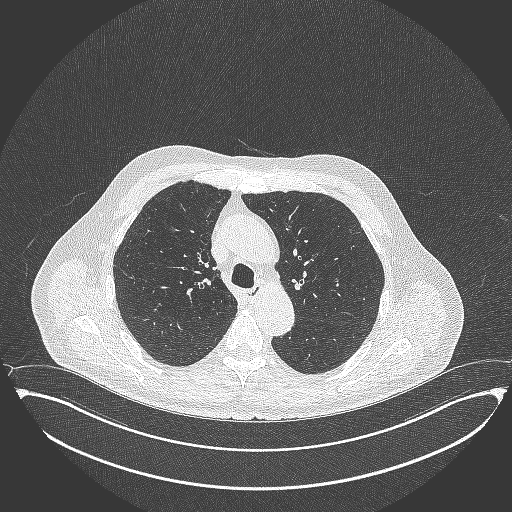
[im 243/297  lung]
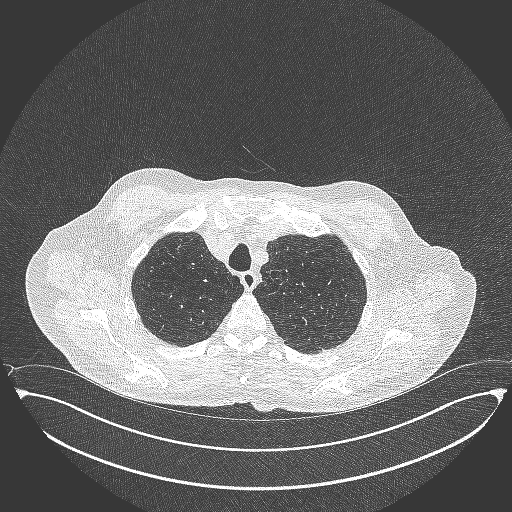
[im 270/297  lung]
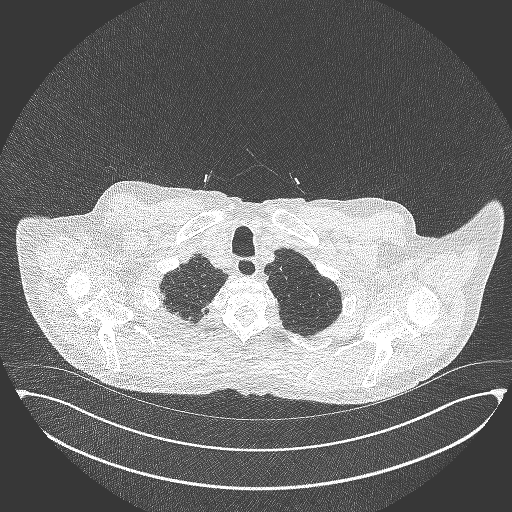

[Series 5: coronal · coronal · 0.66mm/px · 3 of 272 slices shown]
[im 55/272  lung]
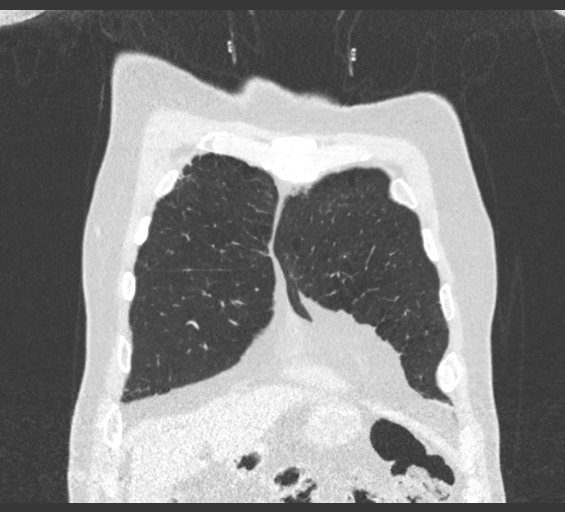
[im 109/272  lung]
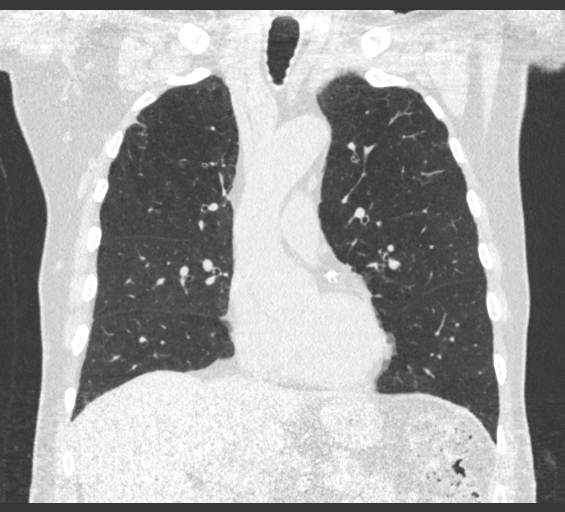
[im 163/272  lung]
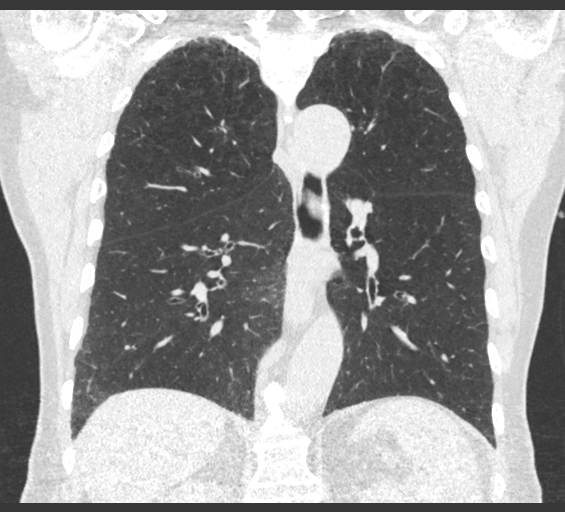

[14 of 36 positions shown; findings below may reference images not displayed]

FINDINGS: Cardiovascular: Aortic atherosclerosis. Tortuous thoracic aorta.
Normal heart size, without pericardial effusion. Multivessel
coronary artery atherosclerosis.

Mediastinum/Nodes: No mediastinal or definite hilar adenopathy,
given limitations of unenhanced CT. Tiny hiatal hernia.

Lungs/Pleura: No pleural fluid. Moderate centrilobular emphysema.
Calcified and noncalcified pulmonary nodules are again identified.
The largest noncalcified nodule measures volume derived equivalent
diameter 4.3 mm, along the right minor fissure.

Upper Abdomen: Normal imaged portions of the liver, spleen,
pancreas, gallbladder, adrenal glands, kidneys.

Musculoskeletal: No acute osseous abnormality.
IMPRESSION: 1. Lung-RADS 2, benign appearance or behavior. Continue annual
screening with low-dose chest CT without contrast in 12 months.
2. Aortic Atherosclerosis (CSS6N-JAB.B) and Emphysema (CSS6N-6V3.N).
Coronary artery atherosclerosis.

## 2021-01-23 DIAGNOSIS — H5203 Hypermetropia, bilateral: Secondary | ICD-10-CM | POA: Diagnosis not present

## 2021-01-23 DIAGNOSIS — H401131 Primary open-angle glaucoma, bilateral, mild stage: Secondary | ICD-10-CM | POA: Diagnosis not present

## 2021-01-31 ENCOUNTER — Other Ambulatory Visit: Payer: Self-pay | Admitting: Family Medicine

## 2021-04-02 ENCOUNTER — Other Ambulatory Visit: Payer: Self-pay

## 2021-04-02 ENCOUNTER — Ambulatory Visit (INDEPENDENT_AMBULATORY_CARE_PROVIDER_SITE_OTHER): Payer: Medicare Other | Admitting: Family Medicine

## 2021-04-02 VITALS — BP 122/56 | HR 64 | Temp 97.6°F | Resp 16 | Ht 72.0 in | Wt 166.6 lb

## 2021-04-02 DIAGNOSIS — R351 Nocturia: Secondary | ICD-10-CM | POA: Diagnosis not present

## 2021-04-02 DIAGNOSIS — N401 Enlarged prostate with lower urinary tract symptoms: Secondary | ICD-10-CM

## 2021-04-02 DIAGNOSIS — N138 Other obstructive and reflux uropathy: Secondary | ICD-10-CM | POA: Diagnosis not present

## 2021-04-02 DIAGNOSIS — E7800 Pure hypercholesterolemia, unspecified: Secondary | ICD-10-CM | POA: Diagnosis not present

## 2021-04-02 DIAGNOSIS — Z79899 Other long term (current) drug therapy: Secondary | ICD-10-CM | POA: Diagnosis not present

## 2021-04-02 DIAGNOSIS — Z0000 Encounter for general adult medical examination without abnormal findings: Secondary | ICD-10-CM

## 2021-04-02 DIAGNOSIS — I10 Essential (primary) hypertension: Secondary | ICD-10-CM

## 2021-04-02 DIAGNOSIS — G4700 Insomnia, unspecified: Secondary | ICD-10-CM

## 2021-04-02 DIAGNOSIS — F411 Generalized anxiety disorder: Secondary | ICD-10-CM

## 2021-04-02 MED ORDER — CETIRIZINE HCL 10 MG PO TABS: 10.0000 mg | ORAL_TABLET | Freq: Every day | ORAL | 1 refills | Status: DC

## 2021-04-02 MED ORDER — ATORVASTATIN CALCIUM 10 MG PO TABS: 5.0000 mg | ORAL_TABLET | Freq: Every day | ORAL | 1 refills | Status: DC

## 2021-04-02 MED ORDER — TEMAZEPAM 30 MG PO CAPS: 30.0000 mg | ORAL_CAPSULE | Freq: Every evening | ORAL | 5 refills | Status: DC | PRN

## 2021-04-02 MED ORDER — METOPROLOL SUCCINATE ER 50 MG PO TB24: ORAL_TABLET | ORAL | 1 refills | Status: DC

## 2021-04-02 MED ORDER — OMEPRAZOLE 20 MG PO CPDR: 20.0000 mg | DELAYED_RELEASE_CAPSULE | Freq: Every day | ORAL | 1 refills | Status: DC

## 2021-04-02 MED ORDER — NABUMETONE 750 MG PO TABS: ORAL_TABLET | ORAL | 1 refills | Status: DC

## 2021-04-02 MED ORDER — HYDROCHLOROTHIAZIDE 12.5 MG PO CAPS: 12.5000 mg | ORAL_CAPSULE | Freq: Every day | ORAL | 1 refills | Status: DC

## 2021-04-02 MED ORDER — ALPRAZOLAM 0.5 MG PO TABS: ORAL_TABLET | ORAL | 5 refills | Status: DC

## 2021-04-02 NOTE — Assessment & Plan Note (Signed)
Uses Alprazolam with good results refills given, UDS updated

## 2021-04-02 NOTE — Patient Instructions (Signed)
Preventive Care 79 Years and Older, Male Preventive care refers to lifestyle choices and visits with your health care provider that can promote health and wellness. This includes: A yearly physical exam. This is also called an annual wellness visit. Regular dental and eye exams. Immunizations. Screening for certain conditions. Healthy lifestyle choices, such as: Eating a healthy diet. Getting regular exercise. Not using drugs or products that contain nicotine and tobacco. Limiting alcohol use. What can I expect for my preventive care visit? Physical exam Your health care provider will check your: Height and weight. These may be used to calculate your BMI (body mass index). BMI is a measurement that tells if you are at a healthy weight. Heart rate and blood pressure. Body temperature. Skin for abnormal spots. Counseling Your health care provider may ask you questions about your: Past medical problems. Family's medical history. Alcohol, tobacco, and drug use. Emotional well-being. Home life and relationship well-being. Sexual activity. Diet, exercise, and sleep habits. History of falls. Memory and ability to understand (cognition). Work and work Statistician. Access to firearms. What immunizations do I need? Vaccines are usually given at various ages, according to a schedule. Your health care provider will recommend vaccines for you based on your age, medical history, and lifestyle or other factors, such as travel or where you work. What tests do I need? Blood tests Lipid and cholesterol levels. These may be checked every 5 years, or more often depending on your overall health. Hepatitis C test. Hepatitis B test. Screening Lung cancer screening. You may have this screening every year starting at age 79 if you have a 30-pack-year history of smoking and currently smoke or have quit within the past 15 years. Colorectal cancer screening. All adults should have this screening  starting at age 79 and continuing until age 73. Your health care provider may recommend screening at age 79 if you are at increased risk. You will have tests every 1-10 years, depending on your results and the type of screening test. Prostate cancer screening. Recommendations will vary depending on your family history and other risks. Genital exam to check for testicular cancer or hernias. Diabetes screening. This is done by checking your blood sugar (glucose) after you have not eaten for a while (fasting). You may have this done every 1-3 years. Abdominal aortic aneurysm (AAA) screening. You may need this if you are a current or former smoker. STD (sexually transmitted disease) testing, if you are at risk. Follow these instructions at home: Eating and drinking  Eat a diet that includes fresh fruits and vegetables, whole grains, lean protein, and low-fat dairy products. Limit your intake of foods with high amounts of sugar, saturated fats, and salt. Take vitamin and mineral supplements as recommended by your health care provider. Do not drink alcohol if your health care provider tells you not to drink. If you drink alcohol: Limit how much you have to 0-2 drinks a day. Be aware of how much alcohol is in your drink. In the [place] one drink equals one 12 oz bottle of beer (355 mL), one 5 oz glass of wine (148 mL), or one 1 oz glass of hard liquor (44 mL). Lifestyle Take daily care of your teeth and gums. Brush your teeth every morning and night with fluoride toothpaste. Floss one time each day. Stay active. Exercise for at least 30 minutes 5 or more days each week. Do not use any products that contain nicotine or tobacco, such as cigarettes, e-cigarettes, and chewing tobacco. If  you need help quitting, ask your health care provider. Do not use drugs. If you are sexually active, practice safe sex. Use a condom or other form of protection to prevent STIs (sexually transmitted infections). Talk  with your health care provider about taking a low-dose aspirin or statin. Find healthy ways to cope with stress, such as: Meditation, yoga, or listening to music. Journaling. Talking to a trusted person. Spending time with friends and family. Safety Always wear your seat belt while driving or riding in a vehicle. Do not drive: If you have been drinking alcohol. Do not ride with someone who has been drinking. When you are tired or distracted. While texting. Wear a helmet and other protective equipment during sports activities. If you have firearms in your house, make sure you follow all gun safety procedures. What's next? Visit your health care provider 80 once a year for an annual wellness visit. Ask your health care provider how often you should have your eyes and teeth checked. Stay up to date on all vaccines. This information is not intended to replace advice given to you by your health care provider. Make sure you discuss any questions you have with your health care provider. Document Revised: 09/12/2020 Document Reviewed: 06/29/2018 Elsevier Patient Education  2022 [employer].

## 2021-04-02 NOTE — Assessment & Plan Note (Signed)
Well controlled, no changes to meds. Encouraged heart healthy diet such as the DASH diet and exercise as tolerated.  °

## 2021-04-02 NOTE — Assessment & Plan Note (Signed)
Does not see urology, check PSA

## 2021-04-02 NOTE — Assessment & Plan Note (Signed)
Encourage heart healthy diet such as MIND or DASH diet, increase exercise, avoid trans fats, simple carbohydrates and processed foods, consider a krill or fish or flaxseed oil cap daily.  °

## 2021-04-02 NOTE — Assessment & Plan Note (Signed)
Encouraged good sleep hygiene such as dark, quiet room. No blue/green glowing lights such as computer screens in bedroom. No alcohol or stimulants in evening. Cut down on caffeine as able. Regular exercise is helpful but not just prior to bed time. Doing well on Temazepam refills given
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
## 2021-04-02 NOTE — Assessment & Plan Note (Signed)
Patient encouraged to maintain heart healthy diet, regular exercise, adequate sleep. Consider daily probiotics. Take medications as prescribed. Tolerating statin, encouraged heart healthy diet, avoid trans fats, minimize simple carbs and saturated fats. Increase exercise as tolerated

## 2021-04-03 LAB — CBC WITH DIFFERENTIAL/PLATELET
Basophils Absolute: 0.1 10*3/uL (ref 0.0–0.1)
Basophils Relative: 0.8 % (ref 0.0–3.0)
Eosinophils Absolute: 0.6 10*3/uL (ref 0.0–0.7)
Eosinophils Relative: 6.6 % — ABNORMAL HIGH (ref 0.0–5.0)
HCT: 42.1 % (ref 39.0–52.0)
Hemoglobin: 14.2 g/dL (ref 13.0–17.0)
Lymphocytes Relative: 24.6 % (ref 12.0–46.0)
Lymphs Abs: 2.1 10*3/uL (ref 0.7–4.0)
MCHC: 33.7 g/dL (ref 30.0–36.0)
MCV: 93.3 fl (ref 78.0–100.0)
Monocytes Absolute: 0.8 10*3/uL (ref 0.1–1.0)
Monocytes Relative: 9.6 % (ref 3.0–12.0)
Neutro Abs: 5 10*3/uL (ref 1.4–7.7)
Neutrophils Relative %: 58.4 % (ref 43.0–77.0)
Platelets: 185 10*3/uL (ref 150.0–400.0)
RBC: 4.51 Mil/uL (ref 4.22–5.81)
RDW: 13.1 % (ref 11.5–15.5)
WBC: 8.5 10*3/uL (ref 4.0–10.5)

## 2021-04-03 LAB — COMPREHENSIVE METABOLIC PANEL
ALT: 10 U/L (ref 0–53)
AST: 18 U/L (ref 0–37)
Albumin: 4.6 g/dL (ref 3.5–5.2)
Alkaline Phosphatase: 85 U/L (ref 39–117)
BUN: 22 mg/dL (ref 6–23)
CO2: 31 mEq/L (ref 19–32)
Calcium: 9.5 mg/dL (ref 8.4–10.5)
Chloride: 101 mEq/L (ref 96–112)
Creatinine, Ser: 1.24 mg/dL (ref 0.40–1.50)
GFR: 55.36 mL/min — ABNORMAL LOW (ref >60.00)
Glucose, Bld: 88 mg/dL (ref 70–99)
Potassium: 5.1 mEq/L (ref 3.5–5.1)
Sodium: 140 mEq/L (ref 135–145)
Total Bilirubin: 1.2 mg/dL (ref 0.2–1.2)
Total Protein: 7.7 g/dL (ref 6.0–8.3)

## 2021-04-03 LAB — LIPID PANEL
Cholesterol: 170 mg/dL (ref 0–200)
HDL: 55 mg/dL (ref >39.00)
LDL Cholesterol: 95 mg/dL (ref 0–99)
NonHDL: 115.38
Total CHOL/HDL Ratio: 3
Triglycerides: 104 mg/dL (ref 0.0–149.0)
VLDL: 20.8 mg/dL (ref 0.0–40.0)

## 2021-04-03 LAB — TSH: TSH: 0.82 u[IU]/mL (ref 0.35–5.50)

## 2021-04-03 LAB — PSA: PSA: 2.02 ng/mL (ref 0.10–4.00)

## 2021-04-05 LAB — DRUG MONITORING, PANEL 8 WITH CONFIRMATION, URINE
6 Acetylmorphine: NEGATIVE ng/mL (ref <10)
Alcohol Metabolites: NEGATIVE ng/mL (ref <500)
Alphahydroxyalprazolam: 213 ng/mL — ABNORMAL HIGH (ref <25)
Alphahydroxymidazolam: NEGATIVE ng/mL (ref <50)
Alphahydroxytriazolam: NEGATIVE ng/mL (ref <50)
Aminoclonazepam: NEGATIVE ng/mL (ref <25)
Amphetamines: NEGATIVE ng/mL (ref <500)
Benzodiazepines: POSITIVE ng/mL — AB (ref <100)
Buprenorphine, Urine: NEGATIVE ng/mL (ref <5)
Cocaine Metabolite: NEGATIVE ng/mL (ref <150)
Creatinine: 131.8 mg/dL (ref >=20.0)
Hydroxyethylflurazepam: NEGATIVE ng/mL (ref <50)
Lorazepam: NEGATIVE ng/mL (ref <50)
MDMA: NEGATIVE ng/mL (ref <500)
Marijuana Metabolite: NEGATIVE ng/mL (ref <20)
Nordiazepam: NEGATIVE ng/mL (ref <50)
Opiates: NEGATIVE ng/mL (ref <100)
Oxazepam: 5427 ng/mL — ABNORMAL HIGH (ref <50)
Oxidant: NEGATIVE ug/mL (ref <200)
Oxycodone: NEGATIVE ng/mL (ref <100)
Temazepam: >6250 ng/mL — ABNORMAL HIGH (ref <50)
pH: 6.9 (ref 4.5–9.0)

## 2021-04-05 LAB — DM TEMPLATE

## 2021-04-29 ENCOUNTER — Ambulatory Visit: Payer: Medicare Other

## 2021-05-14 DIAGNOSIS — R6889 Other general symptoms and signs: Secondary | ICD-10-CM | POA: Diagnosis not present

## 2021-05-21 ENCOUNTER — Telehealth: Payer: Self-pay | Admitting: Family Medicine

## 2021-05-21 NOTE — Telephone Encounter (Signed)
Left message for patient to call back and schedule Medicare Annual Wellness Visit (AWV) in office.  ° °If not able to come in office, please offer to do virtually or by telephone.  Left office number and my jabber #[phone]. ° °Due for AWVI ° °Please schedule at anytime with Nurse Health Advisor. °  °

## 2021-08-07 ENCOUNTER — Encounter: Payer: Self-pay | Admitting: Physician Assistant

## 2021-08-07 ENCOUNTER — Telehealth (INDEPENDENT_AMBULATORY_CARE_PROVIDER_SITE_OTHER): Payer: Medicare Other | Admitting: Physician Assistant

## 2021-08-07 ENCOUNTER — Telehealth: Payer: Self-pay

## 2021-08-07 VITALS — Ht 72.0 in | Wt 170.0 lb

## 2021-08-07 DIAGNOSIS — U071 COVID-19: Secondary | ICD-10-CM | POA: Diagnosis not present

## 2021-08-07 MED ORDER — MOLNUPIRAVIR EUA 200MG CAPSULE: 4.0000 | ORAL_CAPSULE | Freq: Two times a day (BID) | ORAL | 0 refills | Status: AC

## 2021-08-07 NOTE — Telephone Encounter (Signed)
Covid positive: today Sxs started: Yesterday Sxs include: Fever Tx tried: Tylenol, Mycinex, Benadryl

## 2021-08-07 NOTE — Progress Notes (Signed)
° °  Virtual Visit via Video Note  I connected with  Jerome Craig  on 08/07/21 at  1:00 PM EST by a video enabled telemedicine application and verified that I am speaking with the correct person using two identifiers.  Location: Patient: home Provider: Therapist, music at [facility] present: Patient, Patient's wife, and myself   I discussed the limitations of evaluation and management by telemedicine and the availability of in person appointments. The patient expressed understanding and agreed to proceed.   History of Present Illness:  Chief complaint: COVID-19 positive via home test on 08/07/21 Symptom onset: 08/06/21 Pertinent positives: Headache, dry cough, congestion and runny nose, hoarse Pertinent negatives: SOB, CP, N/V/D, abdominal pain, ST Treatments tried: Tylenol, Mucinex, Benadryl last night Vaccine status: UTD on COVID-19 vaccines  Sick exposure: Wife tested positive for COVID this week     Observations/Objective:   Gen: Awake, alert, no acute distress; hoarse voice, congested sounding Resp: Breathing is even and non-labored Psych: calm/pleasant demeanor   Assessment and Plan:  1. COVID-19 Diagnosis confirmed via home antigen test.  We discussed current algorithm recommendations for prescribing outpatient antivirals.  As the patient is over the age of 48 and within 5 day window of onset of symptoms, he could benefit from antiviral treatment.  Risks versus benefits discussed. We both agreed to start Molnupiravir at this time with possible SE discussed. Also advised self-isolation at home for the next 5 days and then masking around others for at least an additional 5 days.  Treat supportively at this time including sleeping prone, deep breathing exercises, pushing fluids, walking every few hours, vitamins C and D, and Tylenol or ibuprofen as needed.  The patient understands that COVID-19 illness can wax and wane.  Should the symptoms acutely worsen  or patient starts to experience sudden shortness of breath, chest pain, severe weakness, the patient will go straight to the emergency department.  Also advised home pulse oximetry monitoring and for any reading consistently under 92%, should also report to the emergency department.  The patient will continue to keep [place] updated.   Follow Up Instructions:    I discussed the assessment and treatment plan with the patient. The patient was provided an opportunity to ask questions and all were answered. The patient agreed with the plan and demonstrated an understanding of the instructions.   The patient was advised to call back or seek an in-person evaluation if the symptoms worsen or if the condition fails to improve as anticipated.  Video connection was lost at >50% of the duration of the visit, at which time the remainder of the visit was completed via audio only.  Total time on phone with patient: 25 minutes, 11 seconds  Sharif Rendell M Naima Veldhuizen, PA-C
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
## 2021-10-06 ENCOUNTER — Encounter: Payer: Self-pay | Admitting: Family Medicine

## 2021-10-06 ENCOUNTER — Ambulatory Visit (INDEPENDENT_AMBULATORY_CARE_PROVIDER_SITE_OTHER): Payer: Medicare Other | Admitting: Family Medicine

## 2021-10-06 VITALS — BP 124/72 | HR 62 | Resp 20 | Ht 72.0 in | Wt 170.4 lb

## 2021-10-06 DIAGNOSIS — E7800 Pure hypercholesterolemia, unspecified: Secondary | ICD-10-CM

## 2021-10-06 DIAGNOSIS — Z79899 Other long term (current) drug therapy: Secondary | ICD-10-CM | POA: Diagnosis not present

## 2021-10-06 DIAGNOSIS — F411 Generalized anxiety disorder: Secondary | ICD-10-CM

## 2021-10-06 DIAGNOSIS — I10 Essential (primary) hypertension: Secondary | ICD-10-CM | POA: Diagnosis not present

## 2021-10-06 DIAGNOSIS — Z0000 Encounter for general adult medical examination without abnormal findings: Secondary | ICD-10-CM | POA: Diagnosis not present

## 2021-10-06 DIAGNOSIS — M25532 Pain in left wrist: Secondary | ICD-10-CM

## 2021-10-06 DIAGNOSIS — G4700 Insomnia, unspecified: Secondary | ICD-10-CM | POA: Diagnosis not present

## 2021-10-06 MED ORDER — AZELASTINE HCL 0.1 % NA SOLN: 1.0000 | Freq: Two times a day (BID) | NASAL | 5 refills | Status: DC

## 2021-10-06 MED ORDER — HYDROCHLOROTHIAZIDE 12.5 MG PO CAPS: 12.5000 mg | ORAL_CAPSULE | Freq: Every day | ORAL | 1 refills | Status: DC

## 2021-10-06 MED ORDER — ATORVASTATIN CALCIUM 10 MG PO TABS: 5.0000 mg | ORAL_TABLET | Freq: Every day | ORAL | 1 refills | Status: DC

## 2021-10-06 MED ORDER — NABUMETONE 750 MG PO TABS
ORAL_TABLET | ORAL | 1 refills | Status: DC
Start: 2021-10-06 — End: 2022-04-06

## 2021-10-06 MED ORDER — CETIRIZINE HCL 10 MG PO TABS: 10.0000 mg | ORAL_TABLET | Freq: Every day | ORAL | 1 refills | Status: DC

## 2021-10-06 MED ORDER — ALPRAZOLAM 0.5 MG PO TABS: ORAL_TABLET | ORAL | 5 refills | Status: DC

## 2021-10-06 MED ORDER — OMEPRAZOLE 20 MG PO CPDR: 20.0000 mg | DELAYED_RELEASE_CAPSULE | Freq: Every day | ORAL | 1 refills | Status: DC

## 2021-10-06 MED ORDER — METOPROLOL SUCCINATE ER 50 MG PO TB24
ORAL_TABLET | ORAL | 1 refills | Status: DC
Start: 2021-10-06 — End: 2022-04-06

## 2021-10-06 MED ORDER — TEMAZEPAM 30 MG PO CAPS
30.0000 mg | ORAL_CAPSULE | Freq: Every evening | ORAL | 5 refills | Status: DC | PRN
Start: 2021-10-06 — End: 2022-04-06

## 2021-10-06 NOTE — Patient Instructions (Addendum)
Can switch from Zyrtec/Cetirizine to Claritin/Loratadine or Allegra/Fexofenadine. ? ?Will add Astelin nasal sprayGout ?Gout is a condition that causes painful swelling of the joints. Gout is a type of inflammation of the joints (arthritis). This condition is caused by having too much uric acid in the body. Uric acid is a chemical that forms when the body breaks down substances called purines. Purines are important for building body proteins. ?When the body has too much uric acid, sharp crystals can form and build up inside the joints. This causes pain and swelling. Gout attacks can happen quickly and may be very painful (acute gout). Over time, the attacks can affect more joints and become more frequent (chronic gout). Gout can also cause uric acid to build up under the skin and inside the kidneys. ?What are the causes? ?This condition is caused by too much uric acid in your blood. This can happen because: ?Your kidneys do not remove enough uric acid from your blood. This is the most common cause. ?Your body makes too much uric acid. This can happen with some cancers and cancer treatments. It can also occur if your body is breaking down too many red blood cells (hemolytic anemia). ?You eat too many foods that are high in purines. These foods include organ meats and some seafood. Alcohol, especially beer, is also high in purines. ?A gout attack may be triggered by trauma or stress. ?What increases the risk? ?You are more likely to develop this condition if you: ?Have a family history of gout. ?Are male and middle-aged. ?Are male and have gone through menopause. ?Are obese. ?Frequently drink alcohol, especially beer. ?Are dehydrated. ?Lose weight too quickly. ?Have an organ transplant. ?Have lead poisoning. ?Take certain medicines, including aspirin, cyclosporine, diuretics, levodopa, and niacin. ?Have kidney disease. ?Have a skin condition called psoriasis. ?What are the signs or symptoms? ?An attack of acute gout  happens quickly. It usually occurs in just one joint. The most common place is the big toe. Attacks often start at night. Other joints that may be affected include joints of the feet, ankle, knee, fingers, wrist, or elbow. Symptoms of this condition may include: ?Severe pain. ?Warmth. ?Swelling. ?Stiffness. ?Tenderness. The affected joint may be very painful to touch. ?Shiny, red, or purple skin. ?Chills and fever. ?Chronic gout may cause symptoms more frequently. More joints may be involved. You may also have white or yellow lumps (tophi) on your hands or feet or in other areas near your joints. ?How is this diagnosed? ?This condition is diagnosed based on your symptoms, medical history, and physical exam. You may have tests, such as: ?Blood tests to measure uric acid levels. ?Removal of joint fluid with a thin needle (aspiration) to look for uric acid crystals. ?X-rays to look for joint damage. ?How is this treated? ?Treatment for this condition has two phases: treating an acute attack and preventing future attacks. Acute gout treatment may include medicines to reduce pain and swelling, including: ?NSAIDs. ?Steroids. These are strong anti-inflammatory medicines that can be taken by mouth (orally) or injected into a joint. ?Colchicine. This medicine relieves pain and swelling when it is taken soon after an attack. It can be given by mouth or through an IV. ?Preventive treatment may include: ?Daily use of smaller doses of NSAIDs or colchicine. ?Use of a medicine that reduces uric acid levels in your blood. ?Changes to your diet. You may need to see a dietitian about what to eat and drink to prevent gout. ?Follow these instructions at home: ?  During a gout attack ? ?If directed, put ice on the affected area: ?Put ice in a plastic bag. ?Place a towel between your skin and the bag. ?Leave the ice on for 20 minutes, 2-3 times a day. ?Raise (elevate) the affected joint above the level of your heart as often as  possible. ?Rest the joint as much as possible. If the affected joint is in your leg, you may be given crutches to use. ?Follow instructions from your health care provider about eating or drinking restrictions. ?Avoiding future gout attacks ?Follow a low-purine diet as told by your dietitian or health care provider. Avoid foods and drinks that are high in purines, including liver, kidney, anchovies, asparagus, herring, mushrooms, mussels, and beer. ?Maintain a healthy weight or lose weight if you are overweight. If you want to lose weight, talk with your health care provider. It is important that you do not lose weight too quickly. ?Start or maintain an exercise program as told by your health care provider. ?Eating and drinking ?Drink enough fluids to keep your urine pale yellow. ?If you drink alcohol: ?Limit how much you use to: ?0-1 drink a day for women. ?0-2 drinks a day for men. ?Be aware of how much alcohol is in your drink. In the [place] one drink equals one 12 oz bottle of beer (355 mL) one 5 oz glass of wine (148 mL), or one 1? oz glass of hard liquor (44 mL). ?General instructions ?Take over-the-counter and prescription medicines only as told by your health care provider. ?Do not drive or use heavy machinery while taking prescription pain medicine. ?Return to your normal activities as told by your health care provider. Ask your health care provider what activities are safe for you. ?Keep all follow-up visits as told by your health care provider. This is important. ?Contact a health care provider if you have: ?Another gout attack. ?Continuing symptoms of a gout attack after 10 days of treatment. ?Side effects from your medicines. ?Chills or a fever. ?Burning pain when you urinate. ?Pain in your lower back or belly. ?Get help right away if you: ?Have severe or uncontrolled pain. ?Cannot urinate. ?Summary ?Gout is painful swelling of the joints caused by inflammation. ?The most common site of pain is the big  toe, but it can affect other joints in the body. ?Medicines and dietary changes can help to prevent and treat gout attacks. ?This information is not intended to replace advice given to you by your health care provider. Make sure you discuss any questions you have with your health care provider. ?Document Revised: 01/13/2018 Document Reviewed: 01/25/2018 ?Elsevier Patient Education ? [place]. ? ?

## 2021-10-07 ENCOUNTER — Telehealth: Payer: Self-pay

## 2021-10-07 DIAGNOSIS — M25532 Pain in left wrist: Secondary | ICD-10-CM | POA: Insufficient documentation

## 2021-10-07 LAB — COMPREHENSIVE METABOLIC PANEL
ALT: 12 U/L (ref 0–53)
AST: 21 U/L (ref 0–37)
Albumin: 4.5 g/dL (ref 3.5–5.2)
Alkaline Phosphatase: 83 U/L (ref 39–117)
BUN: 29 mg/dL — ABNORMAL HIGH (ref 6–23)
CO2: 31 mEq/L (ref 19–32)
Calcium: 9.5 mg/dL (ref 8.4–10.5)
Chloride: 100 mEq/L (ref 96–112)
Creatinine, Ser: 1.13 mg/dL (ref 0.40–1.50)
GFR: 61.67 mL/min (ref >60.00)
Glucose, Bld: 86 mg/dL (ref 70–99)
Potassium: 4.5 mEq/L (ref 3.5–5.1)
Sodium: 139 mEq/L (ref 135–145)
Total Bilirubin: 1.4 mg/dL — ABNORMAL HIGH (ref 0.2–1.2)
Total Protein: 7.5 g/dL (ref 6.0–8.3)

## 2021-10-07 LAB — CBC WITH DIFFERENTIAL/PLATELET
Basophils Absolute: 0.1 10*3/uL (ref 0.0–0.1)
Basophils Relative: 1.2 % (ref 0.0–3.0)
Eosinophils Absolute: 0.5 10*3/uL (ref 0.0–0.7)
Eosinophils Relative: 6.8 % — ABNORMAL HIGH (ref 0.0–5.0)
HCT: 40.2 % (ref 39.0–52.0)
Hemoglobin: 13.7 g/dL (ref 13.0–17.0)
Lymphocytes Relative: 22.3 % (ref 12.0–46.0)
Lymphs Abs: 1.6 10*3/uL (ref 0.7–4.0)
MCHC: 34.1 g/dL (ref 30.0–36.0)
MCV: 91.7 fl (ref 78.0–100.0)
Monocytes Absolute: 0.7 10*3/uL (ref 0.1–1.0)
Monocytes Relative: 10.2 % (ref 3.0–12.0)
Neutro Abs: 4.4 10*3/uL (ref 1.4–7.7)
Neutrophils Relative %: 59.5 % (ref 43.0–77.0)
Platelets: 182 10*3/uL (ref 150.0–400.0)
RBC: 4.39 Mil/uL (ref 4.22–5.81)
RDW: 13.8 % (ref 11.5–15.5)
WBC: 7.3 10*3/uL (ref 4.0–10.5)

## 2021-10-07 LAB — LIPID PANEL
Cholesterol: 172 mg/dL (ref 0–200)
HDL: 50.7 mg/dL (ref >39.00)
LDL Cholesterol: 102 mg/dL — ABNORMAL HIGH (ref 0–99)
NonHDL: 121.74
Total CHOL/HDL Ratio: 3
Triglycerides: 100 mg/dL (ref 0.0–149.0)
VLDL: 20 mg/dL (ref 0.0–40.0)

## 2021-10-07 LAB — SEDIMENTATION RATE: Sed Rate: 6 mm/hr (ref 0–20)

## 2021-10-07 LAB — HIGH SENSITIVITY CRP: CRP, High Sensitivity: 3.02 mg/L (ref 0.000–5.000)

## 2021-10-07 LAB — URIC ACID: Uric Acid, Serum: 6.7 mg/dL (ref 4.0–7.8)

## 2021-10-07 LAB — TSH: TSH: 0.51 u[IU]/mL (ref 0.35–5.50)

## 2021-10-07 NOTE — Assessment & Plan Note (Signed)
Try alternating heat and cold. May try Tylenol and prescription meds as directed and report if symptoms worsen or seek immediate care. Uric acid level is normal ?

## 2021-10-07 NOTE — Telephone Encounter (Signed)
Arbie Cookey with [facility] in [place] called stating [employer] have a new process in that they have to follow up with providers when they are prescribing two medications in the benzo class for further explanation and she called regarding xanax and temazepam.  She stated she knows pt has been on them long term, but she still had to call based on this new process [facility] has implemented since these meds are in the same classification.  CB number is [phone]. ?

## 2021-10-07 NOTE — Assessment & Plan Note (Signed)
Tolerating statin, encouraged heart healthy diet, avoid trans fats, minimize simple carbs and saturated fats. Increase exercise as tolerated. Doing well on Restoril ?

## 2021-10-07 NOTE — Assessment & Plan Note (Signed)
Maintain good sleep hygiene such as dark, quiet room. No blue/green glowing lights such as computer screens in bedroom. No alcohol or stimulants in evening. Cut down on caffeine as able. Regular exercise is helpful but not just prior to bed time.   

## 2021-10-07 NOTE — Assessment & Plan Note (Signed)
Well controlled, no changes to meds. Encouraged heart healthy diet such as the DASH diet and exercise as tolerated.  °

## 2021-10-08 ENCOUNTER — Telehealth: Payer: Self-pay | Admitting: Family Medicine

## 2021-10-08 NOTE — Telephone Encounter (Signed)
Pt 's wife states pharmacy is requiring clarification from the dr regarding  ALPRAZolam Duanne Moron) 0.5 MG tablet  and  ? ?temazepam (RESTORIL) 30 MG capsule    ? ?[place], [place] - [place]  ? [address], [place] [address]  ?Phone:  [phone]  Fax:  [phone]  ?

## 2021-10-08 NOTE — Telephone Encounter (Signed)
Clarified made with [employer]. ?

## 2021-10-08 NOTE — Telephone Encounter (Signed)
Clarified with [employer]. ?

## 2021-10-10 LAB — DM TEMPLATE

## 2021-10-10 LAB — DRUG MONITORING, PANEL 8 WITH CONFIRMATION, URINE
6 Acetylmorphine: NEGATIVE ng/mL (ref <10)
Alcohol Metabolites: NEGATIVE ng/mL (ref <500)
Alphahydroxyalprazolam: 260 ng/mL — ABNORMAL HIGH (ref <25)
Alphahydroxymidazolam: NEGATIVE ng/mL (ref <50)
Alphahydroxytriazolam: NEGATIVE ng/mL (ref <50)
Aminoclonazepam: NEGATIVE ng/mL (ref <25)
Amphetamines: NEGATIVE ng/mL (ref <500)
Benzodiazepines: POSITIVE ng/mL — AB (ref <100)
Buprenorphine, Urine: NEGATIVE ng/mL (ref <5)
Cocaine Metabolite: NEGATIVE ng/mL (ref <150)
Creatinine: 150.2 mg/dL (ref >=20.0)
Hydroxyethylflurazepam: NEGATIVE ng/mL (ref <50)
Lorazepam: NEGATIVE ng/mL (ref <50)
MDMA: NEGATIVE ng/mL (ref <500)
Marijuana Metabolite: NEGATIVE ng/mL (ref <20)
Nordiazepam: NEGATIVE ng/mL (ref <50)
Opiates: NEGATIVE ng/mL (ref <100)
Oxazepam: 5234 ng/mL — ABNORMAL HIGH (ref <50)
Oxidant: NEGATIVE ug/mL (ref <200)
Oxycodone: NEGATIVE ng/mL (ref <100)
Temazepam: >6250 ng/mL — ABNORMAL HIGH (ref <50)
pH: 5.6 (ref 4.5–9.0)

## 2021-10-29 ENCOUNTER — Telehealth: Payer: Self-pay | Admitting: Family Medicine

## 2021-10-29 NOTE — Telephone Encounter (Signed)
Patient's spouse called stating husband needed a handicap placard, she would like a call back to discuss the steps on how to get one. Spouse would also like a call back to discuss other issues that she did not want to disclose at the time. Please advise.   ?

## 2021-10-30 NOTE — Telephone Encounter (Signed)
Handicap placard completed and mailed to pt. Called pt. No answer but left VM regarding placard and asked for call back to discuss other issues. ?

## 2021-11-06 DIAGNOSIS — R1031 Right lower quadrant pain: Secondary | ICD-10-CM | POA: Diagnosis not present

## 2021-11-17 DIAGNOSIS — N3289 Other specified disorders of bladder: Secondary | ICD-10-CM | POA: Diagnosis not present

## 2021-11-17 DIAGNOSIS — R1031 Right lower quadrant pain: Secondary | ICD-10-CM | POA: Diagnosis not present

## 2021-11-17 DIAGNOSIS — K5730 Diverticulosis of large intestine without perforation or abscess without bleeding: Secondary | ICD-10-CM | POA: Diagnosis not present

## 2021-11-17 DIAGNOSIS — K4090 Unilateral inguinal hernia, without obstruction or gangrene, not specified as recurrent: Secondary | ICD-10-CM | POA: Diagnosis not present

## 2021-12-08 DIAGNOSIS — R1031 Right lower quadrant pain: Secondary | ICD-10-CM | POA: Diagnosis not present

## 2022-01-25 DIAGNOSIS — Z961 Presence of intraocular lens: Secondary | ICD-10-CM | POA: Diagnosis not present

## 2022-01-25 DIAGNOSIS — H401131 Primary open-angle glaucoma, bilateral, mild stage: Secondary | ICD-10-CM | POA: Diagnosis not present

## 2022-01-25 DIAGNOSIS — H52203 Unspecified astigmatism, bilateral: Secondary | ICD-10-CM | POA: Diagnosis not present

## 2022-01-25 DIAGNOSIS — H353111 Nonexudative age-related macular degeneration, right eye, early dry stage: Secondary | ICD-10-CM | POA: Diagnosis not present

## 2022-04-05 NOTE — Assessment & Plan Note (Signed)
Patient encouraged to maintain heart healthy diet, regular exercise, adequate sleep. Consider daily probiotics. Take medications as prescribed. Labs ordered and reviewed. . RSV (respiratory syncitial virus) vaccine at pharmacy Covid booster when new version out late September At pharmacy High dose flu shot mid Sept to mid Oct Has aged out of colonoscopy

## 2022-04-05 NOTE — Assessment & Plan Note (Signed)
Well controlled, no changes to meds. Encouraged heart healthy diet such as the DASH diet and exercise as tolerated.  °

## 2022-04-06 ENCOUNTER — Other Ambulatory Visit: Payer: Self-pay

## 2022-04-06 ENCOUNTER — Ambulatory Visit (INDEPENDENT_AMBULATORY_CARE_PROVIDER_SITE_OTHER): Payer: Medicare Other | Admitting: Family Medicine

## 2022-04-06 ENCOUNTER — Encounter: Payer: Self-pay | Admitting: Family Medicine

## 2022-04-06 VITALS — BP 126/74 | HR 60 | Temp 97.6°F | Resp 16 | Ht 71.0 in | Wt 169.0 lb

## 2022-04-06 DIAGNOSIS — Z0000 Encounter for general adult medical examination without abnormal findings: Secondary | ICD-10-CM

## 2022-04-06 DIAGNOSIS — I10 Essential (primary) hypertension: Secondary | ICD-10-CM

## 2022-04-06 DIAGNOSIS — F411 Generalized anxiety disorder: Secondary | ICD-10-CM

## 2022-04-06 DIAGNOSIS — Z79899 Other long term (current) drug therapy: Secondary | ICD-10-CM | POA: Diagnosis not present

## 2022-04-06 DIAGNOSIS — G4700 Insomnia, unspecified: Secondary | ICD-10-CM | POA: Diagnosis not present

## 2022-04-06 DIAGNOSIS — Z23 Encounter for immunization: Secondary | ICD-10-CM

## 2022-04-06 DIAGNOSIS — E7800 Pure hypercholesterolemia, unspecified: Secondary | ICD-10-CM

## 2022-04-06 MED ORDER — NABUMETONE 750 MG PO TABS: ORAL_TABLET | ORAL | 1 refills | Status: DC

## 2022-04-06 MED ORDER — ALPRAZOLAM 0.5 MG PO TABS: ORAL_TABLET | ORAL | 5 refills | Status: DC

## 2022-04-06 MED ORDER — HYDROCHLOROTHIAZIDE 12.5 MG PO CAPS: 12.5000 mg | ORAL_CAPSULE | Freq: Every day | ORAL | 1 refills | Status: DC

## 2022-04-06 MED ORDER — METOPROLOL SUCCINATE ER 50 MG PO TB24: ORAL_TABLET | ORAL | 1 refills | Status: DC

## 2022-04-06 MED ORDER — TEMAZEPAM 30 MG PO CAPS: 30.0000 mg | ORAL_CAPSULE | Freq: Every evening | ORAL | 5 refills | Status: DC | PRN

## 2022-04-06 MED ORDER — ATORVASTATIN CALCIUM 10 MG PO TABS: 5.0000 mg | ORAL_TABLET | Freq: Every day | ORAL | 1 refills | Status: DC

## 2022-04-06 MED ORDER — OMEPRAZOLE 20 MG PO CPDR: 20.0000 mg | DELAYED_RELEASE_CAPSULE | Freq: Every day | ORAL | 1 refills | Status: DC

## 2022-04-06 NOTE — Patient Instructions (Signed)
Preventive Care 65 Years and Older, Male Preventive care refers to lifestyle choices and visits with your health care provider that can promote health and wellness. Preventive care visits are also called wellness exams. What can I expect for my preventive care visit? Counseling During your preventive care visit, your health care provider may ask about your: Medical history, including: Past medical problems. Family medical history. History of falls. Current health, including: Emotional well-being. Home life and relationship well-being. Sexual activity. Memory and ability to understand (cognition). Lifestyle, including: Alcohol, nicotine or tobacco, and drug use. Access to firearms. Diet, exercise, and sleep habits. Work and work environment. Sunscreen use. Safety issues such as seatbelt and bike helmet use. Physical exam Your health care provider will check your: Height and weight. These may be used to calculate your BMI (body mass index). BMI is a measurement that tells if you are at a healthy weight. Waist circumference. This measures the distance around your waistline. This measurement also tells if you are at a healthy weight and may help predict your risk of certain diseases, such as type 2 diabetes and high blood pressure. Heart rate and blood pressure. Body temperature. Skin for abnormal spots. What immunizations do I need?  Vaccines are usually given at various ages, according to a schedule. Your health care provider will recommend vaccines for you based on your age, medical history, and lifestyle or other factors, such as travel or where you work. What tests do I need? Screening Your health care provider may recommend screening tests for certain conditions. This may include: Lipid and cholesterol levels. Diabetes screening. This is done by checking your blood sugar (glucose) after you have not eaten for a while (fasting). Hepatitis C test. Hepatitis B test. HIV (human  immunodeficiency virus) test. STI (sexually transmitted infection) testing, if you are at risk. Lung cancer screening. Colorectal cancer screening. Prostate cancer screening. Abdominal aortic aneurysm (AAA) screening. You may need this if you are a current or former smoker. Talk with your health care provider about your test results, treatment options, and if necessary, the need for more tests. Follow these instructions at home: Eating and drinking  Eat a diet that includes fresh fruits and vegetables, whole grains, lean protein, and low-fat dairy products. Limit your intake of foods with high amounts of sugar, saturated fats, and salt. Take vitamin and mineral supplements as recommended by your health care provider. Do not drink alcohol if your health care provider tells you not to drink. If you drink alcohol: Limit how much you have to 0-2 drinks a day. Know how much alcohol is in your drink. In the [place] one drink equals one 12 oz bottle of beer (355 mL), one 5 oz glass of wine (148 mL), or one 1 oz glass of hard liquor (44 mL). Lifestyle Brush your teeth every morning and night with fluoride toothpaste. Floss one time each day. Exercise for at least 30 minutes 5 or more days each week. Do not use any products that contain nicotine or tobacco. These products include cigarettes, chewing tobacco, and vaping devices, such as e-cigarettes. If you need help quitting, ask your health care provider. Do not use drugs. If you are sexually active, practice safe sex. Use a condom or other form of protection to prevent STIs. Take aspirin only as told by your health care provider. Make sure that you understand how much to take and what form to take. Work with your health care provider to find out whether it is safe   and beneficial for you to take aspirin daily. Ask your health care provider if you need to take a cholesterol-lowering medicine (statin). Find healthy ways to manage stress, such  as: Meditation, yoga, or listening to music. Journaling. Talking to a trusted person. Spending time with friends and family. Safety Always wear your seat belt while driving or riding in a vehicle. Do not drive: If you have been drinking alcohol. Do not ride with someone who has been drinking. When you are tired or distracted. While texting. If you have been using any mind-altering substances or drugs. Wear a helmet and other protective equipment during sports activities. If you have firearms in your house, make sure you follow all gun safety procedures. Minimize exposure to UV radiation to reduce your risk of skin cancer. What's next? Visit your health care provider once a year for an annual wellness visit. Ask your health care provider how often you should have your eyes and teeth checked. Stay up to date on all vaccines. This information is not intended to replace advice given to you by your health care provider. Make sure you discuss any questions you have with your health care provider. Document Revised: 12/31/2020 Document Reviewed: 12/31/2020 Elsevier Patient Education  2023 [employer].  
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
## 2022-04-07 LAB — COMPREHENSIVE METABOLIC PANEL
ALT: 10 U/L (ref 0–53)
AST: 18 U/L (ref 0–37)
Albumin: 4.2 g/dL (ref 3.5–5.2)
Alkaline Phosphatase: 91 U/L (ref 39–117)
BUN: 22 mg/dL (ref 6–23)
CO2: 30 mEq/L (ref 19–32)
Calcium: 9.6 mg/dL (ref 8.4–10.5)
Chloride: 101 mEq/L (ref 96–112)
Creatinine, Ser: 1.28 mg/dL (ref 0.40–1.50)
GFR: 52.91 mL/min — ABNORMAL LOW (ref >60.00)
Glucose, Bld: 87 mg/dL (ref 70–99)
Potassium: 5 mEq/L (ref 3.5–5.1)
Sodium: 140 mEq/L (ref 135–145)
Total Bilirubin: 1.2 mg/dL (ref 0.2–1.2)
Total Protein: 7.4 g/dL (ref 6.0–8.3)

## 2022-04-07 LAB — LIPID PANEL
Cholesterol: 166 mg/dL (ref 0–200)
HDL: 52.1 mg/dL (ref >39.00)
LDL Cholesterol: 98 mg/dL (ref 0–99)
NonHDL: 113.77
Total CHOL/HDL Ratio: 3
Triglycerides: 79 mg/dL (ref 0.0–149.0)
VLDL: 15.8 mg/dL (ref 0.0–40.0)

## 2022-04-07 LAB — CBC
HCT: 41.5 % (ref 39.0–52.0)
Hemoglobin: 13.9 g/dL (ref 13.0–17.0)
MCHC: 33.6 g/dL (ref 30.0–36.0)
MCV: 92.4 fl (ref 78.0–100.0)
Platelets: 187 10*3/uL (ref 150.0–400.0)
RBC: 4.49 Mil/uL (ref 4.22–5.81)
RDW: 13.8 % (ref 11.5–15.5)
WBC: 7.7 10*3/uL (ref 4.0–10.5)

## 2022-04-07 LAB — TSH: TSH: 0.67 u[IU]/mL (ref 0.35–5.50)

## 2022-04-07 NOTE — Assessment & Plan Note (Signed)
Uses Alprazolam sparingly without concerning side effects.

## 2022-04-07 NOTE — Assessment & Plan Note (Signed)
Encouraged good sleep hygiene such as dark, quiet room. No blue/green glowing lights such as computer screens in bedroom. No alcohol or stimulants in evening. Cut down on caffeine as able. Regular exercise is helpful but not just prior to bed time. Sleeps well with temazepam without concerning side effects. Refill given today

## 2022-04-08 LAB — DRUG MONITORING, PANEL 8 WITH CONFIRMATION, URINE
6 Acetylmorphine: NEGATIVE ng/mL (ref <10)
Alcohol Metabolites: NEGATIVE ng/mL (ref <500)
Alphahydroxyalprazolam: 128 ng/mL — ABNORMAL HIGH (ref <25)
Alphahydroxymidazolam: NEGATIVE ng/mL (ref <50)
Alphahydroxytriazolam: NEGATIVE ng/mL (ref <50)
Aminoclonazepam: NEGATIVE ng/mL (ref <25)
Amphetamines: NEGATIVE ng/mL (ref <500)
Benzodiazepines: POSITIVE ng/mL — AB (ref <100)
Buprenorphine, Urine: NEGATIVE ng/mL (ref <5)
Cocaine Metabolite: NEGATIVE ng/mL (ref <150)
Creatinine: 65.1 mg/dL (ref >=20.0)
Hydroxyethylflurazepam: NEGATIVE ng/mL (ref <50)
Lorazepam: NEGATIVE ng/mL (ref <50)
MDMA: NEGATIVE ng/mL (ref <500)
Marijuana Metabolite: NEGATIVE ng/mL (ref <20)
Nordiazepam: NEGATIVE ng/mL (ref <50)
Opiates: NEGATIVE ng/mL (ref <100)
Oxazepam: 2751 ng/mL — ABNORMAL HIGH (ref <50)
Oxidant: NEGATIVE ug/mL (ref <200)
Oxycodone: NEGATIVE ng/mL (ref <100)
Temazepam: >6250 ng/mL — ABNORMAL HIGH (ref <50)
pH: 6.5 (ref 4.5–9.0)

## 2022-04-08 LAB — DM TEMPLATE

## 2022-05-03 ENCOUNTER — Telehealth: Payer: Self-pay

## 2022-05-03 NOTE — Telephone Encounter (Signed)
Opened to print labs

## 2022-05-24 DIAGNOSIS — D485 Neoplasm of uncertain behavior of skin: Secondary | ICD-10-CM | POA: Diagnosis not present

## 2022-05-24 DIAGNOSIS — C44311 Basal cell carcinoma of skin of nose: Secondary | ICD-10-CM | POA: Diagnosis not present

## 2022-05-25 ENCOUNTER — Telehealth: Payer: Self-pay | Admitting: Family Medicine

## 2022-05-25 NOTE — Telephone Encounter (Signed)
Patient's wife called wanting to know if her husband had a PSA done this year. She stated she needs to know for insurance purposes. Please advise.

## 2022-05-26 ENCOUNTER — Other Ambulatory Visit: Payer: Self-pay

## 2022-05-26 DIAGNOSIS — Z125 Encounter for screening for malignant neoplasm of prostate: Secondary | ICD-10-CM

## 2022-05-26 NOTE — Telephone Encounter (Signed)
Jerome Craig (spouse DPR ok) called to follow up on status of question. Advised that Dr. Charlett Blake had looked into it and someone would be calling later on to go over this information and to keep an eye out for their call. Jerome Craig acknowledged understanding.

## 2022-05-26 NOTE — Telephone Encounter (Signed)
Please advise ok to order test Called pt was advised and she stated would like to have it done If its ok to that his insurance will give refund  if test is done.

## 2022-05-26 NOTE — Telephone Encounter (Signed)
Sure thing great got it ordered and appt made

## 2022-06-03 ENCOUNTER — Other Ambulatory Visit: Payer: Medicare Other

## 2022-06-03 ENCOUNTER — Other Ambulatory Visit (INDEPENDENT_AMBULATORY_CARE_PROVIDER_SITE_OTHER): Payer: Medicare Other

## 2022-06-03 DIAGNOSIS — Z125 Encounter for screening for malignant neoplasm of prostate: Secondary | ICD-10-CM | POA: Diagnosis not present

## 2022-06-03 LAB — PSA: PSA: 1.77 ng/mL (ref 0.10–4.00)

## 2022-07-28 DIAGNOSIS — C44311 Basal cell carcinoma of skin of nose: Secondary | ICD-10-CM | POA: Diagnosis not present

## 2022-08-04 DIAGNOSIS — L905 Scar conditions and fibrosis of skin: Secondary | ICD-10-CM | POA: Diagnosis not present

## 2022-08-05 ENCOUNTER — Telehealth: Payer: Self-pay

## 2022-08-05 NOTE — Telephone Encounter (Signed)
Received denial letter- unsure who ran the PA. Drugs available without a prescription and over-the-counter are excluded from coverage under Medicare Part D drug plan

## 2022-09-13 DIAGNOSIS — L570 Actinic keratosis: Secondary | ICD-10-CM | POA: Diagnosis not present

## 2022-09-13 DIAGNOSIS — L821 Other seborrheic keratosis: Secondary | ICD-10-CM | POA: Diagnosis not present

## 2022-09-13 DIAGNOSIS — L820 Inflamed seborrheic keratosis: Secondary | ICD-10-CM | POA: Diagnosis not present

## 2022-09-13 DIAGNOSIS — L720 Epidermal cyst: Secondary | ICD-10-CM | POA: Diagnosis not present

## 2022-09-13 DIAGNOSIS — L578 Other skin changes due to chronic exposure to nonionizing radiation: Secondary | ICD-10-CM | POA: Diagnosis not present

## 2022-09-13 DIAGNOSIS — Z85828 Personal history of other malignant neoplasm of skin: Secondary | ICD-10-CM | POA: Diagnosis not present

## 2022-10-04 DIAGNOSIS — L905 Scar conditions and fibrosis of skin: Secondary | ICD-10-CM | POA: Diagnosis not present

## 2022-10-07 ENCOUNTER — Other Ambulatory Visit: Payer: Self-pay

## 2022-10-07 ENCOUNTER — Ambulatory Visit (INDEPENDENT_AMBULATORY_CARE_PROVIDER_SITE_OTHER): Payer: Medicare HMO | Admitting: Family Medicine

## 2022-10-07 VITALS — BP 130/72 | HR 64 | Temp 97.5°F | Resp 16 | Ht 71.0 in | Wt 171.0 lb

## 2022-10-07 DIAGNOSIS — E7800 Pure hypercholesterolemia, unspecified: Secondary | ICD-10-CM

## 2022-10-07 DIAGNOSIS — R69 Illness, unspecified: Secondary | ICD-10-CM | POA: Diagnosis not present

## 2022-10-07 DIAGNOSIS — J449 Chronic obstructive pulmonary disease, unspecified: Secondary | ICD-10-CM | POA: Diagnosis not present

## 2022-10-07 DIAGNOSIS — M159 Polyosteoarthritis, unspecified: Secondary | ICD-10-CM | POA: Diagnosis not present

## 2022-10-07 DIAGNOSIS — I10 Essential (primary) hypertension: Secondary | ICD-10-CM | POA: Diagnosis not present

## 2022-10-07 DIAGNOSIS — G4700 Insomnia, unspecified: Secondary | ICD-10-CM

## 2022-10-07 DIAGNOSIS — F411 Generalized anxiety disorder: Secondary | ICD-10-CM

## 2022-10-07 DIAGNOSIS — Z79899 Other long term (current) drug therapy: Secondary | ICD-10-CM

## 2022-10-07 MED ORDER — TEMAZEPAM 30 MG PO CAPS: 30.0000 mg | ORAL_CAPSULE | Freq: Every evening | ORAL | 5 refills | Status: DC | PRN

## 2022-10-07 MED ORDER — FAMOTIDINE 40 MG PO TABS: 40.0000 mg | ORAL_TABLET | Freq: Every day | ORAL | 1 refills | Status: DC

## 2022-10-07 MED ORDER — ALPRAZOLAM 0.5 MG PO TABS: ORAL_TABLET | ORAL | 5 refills | Status: DC

## 2022-10-07 MED ORDER — NABUMETONE 750 MG PO TABS: ORAL_TABLET | ORAL | 1 refills | Status: DC

## 2022-10-07 MED ORDER — METOPROLOL SUCCINATE ER 50 MG PO TB24: ORAL_TABLET | ORAL | 1 refills | Status: DC

## 2022-10-07 MED ORDER — HYDROCHLOROTHIAZIDE 12.5 MG PO CAPS: 12.5000 mg | ORAL_CAPSULE | Freq: Every day | ORAL | 1 refills | Status: DC

## 2022-10-07 MED ORDER — ATORVASTATIN CALCIUM 10 MG PO TABS: 5.0000 mg | ORAL_TABLET | Freq: Every day | ORAL | 1 refills | Status: DC
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
## 2022-10-07 NOTE — Patient Instructions (Signed)

## 2022-10-08 ENCOUNTER — Other Ambulatory Visit: Payer: Self-pay

## 2022-10-08 ENCOUNTER — Telehealth: Payer: Self-pay | Admitting: Family Medicine

## 2022-10-08 DIAGNOSIS — G4700 Insomnia, unspecified: Secondary | ICD-10-CM | POA: Diagnosis not present

## 2022-10-08 DIAGNOSIS — Z79899 Other long term (current) drug therapy: Secondary | ICD-10-CM | POA: Diagnosis not present

## 2022-10-08 LAB — CBC WITH DIFFERENTIAL/PLATELET
Basophils Absolute: 0.1 10*3/uL (ref 0.0–0.1)
Basophils Relative: 1.1 % (ref 0.0–3.0)
Eosinophils Absolute: 0.5 10*3/uL (ref 0.0–0.7)
Eosinophils Relative: 7 % — ABNORMAL HIGH (ref 0.0–5.0)
HCT: 42.2 % (ref 39.0–52.0)
Hemoglobin: 14.4 g/dL (ref 13.0–17.0)
Lymphocytes Relative: 25.8 % (ref 12.0–46.0)
Lymphs Abs: 2 10*3/uL (ref 0.7–4.0)
MCHC: 34.1 g/dL (ref 30.0–36.0)
MCV: 93.1 fl (ref 78.0–100.0)
Monocytes Absolute: 0.7 10*3/uL (ref 0.1–1.0)
Monocytes Relative: 9.4 % (ref 3.0–12.0)
Neutro Abs: 4.4 10*3/uL (ref 1.4–7.7)
Neutrophils Relative %: 56.7 % (ref 43.0–77.0)
Platelets: 205 10*3/uL (ref 150.0–400.0)
RBC: 4.53 Mil/uL (ref 4.22–5.81)
RDW: 13.5 % (ref 11.5–15.5)
WBC: 7.7 10*3/uL (ref 4.0–10.5)

## 2022-10-08 LAB — COMPREHENSIVE METABOLIC PANEL
ALT: 12 U/L (ref 0–53)
AST: 19 U/L (ref 0–37)
Albumin: 4.6 g/dL (ref 3.5–5.2)
Alkaline Phosphatase: 88 U/L (ref 39–117)
BUN: 24 mg/dL — ABNORMAL HIGH (ref 6–23)
CO2: 30 mEq/L (ref 19–32)
Calcium: 9.5 mg/dL (ref 8.4–10.5)
Chloride: 100 mEq/L (ref 96–112)
Creatinine, Ser: 1.32 mg/dL (ref 0.40–1.50)
GFR: 50.82 mL/min — ABNORMAL LOW (ref >60.00)
Glucose, Bld: 92 mg/dL (ref 70–99)
Potassium: 4.8 mEq/L (ref 3.5–5.1)
Sodium: 140 mEq/L (ref 135–145)
Total Bilirubin: 1.1 mg/dL (ref 0.2–1.2)
Total Protein: 7.8 g/dL (ref 6.0–8.3)

## 2022-10-08 LAB — LIPID PANEL
Cholesterol: 172 mg/dL (ref 0–200)
HDL: 52.2 mg/dL (ref >39.00)
LDL Cholesterol: 101 mg/dL — ABNORMAL HIGH (ref 0–99)
NonHDL: 119.98
Total CHOL/HDL Ratio: 3
Triglycerides: 97 mg/dL (ref 0.0–149.0)
VLDL: 19.4 mg/dL (ref 0.0–40.0)

## 2022-10-08 LAB — TSH: TSH: 1.14 u[IU]/mL (ref 0.35–5.50)

## 2022-10-08 NOTE — Assessment & Plan Note (Signed)
Doing well. Uses Alprazolam prn with good results and infrequently

## 2022-10-08 NOTE — Telephone Encounter (Signed)
Pt's wife requested copy of labs to be mailed to their home address.

## 2022-10-08 NOTE — Assessment & Plan Note (Signed)
Encouraged good sleep hygiene such as dark, quiet room. No blue/green glowing lights such as computer screens in bedroom. No alcohol or stimulants in evening. Cut down on caffeine as able. Regular exercise is helpful but not just prior to bed time.  Refill given on Restoril

## 2022-10-08 NOTE — Assessment & Plan Note (Signed)
Well controlled, no changes to meds. Encouraged heart healthy diet such as the DASH diet and exercise as tolerated.  °

## 2022-10-08 NOTE — Assessment & Plan Note (Signed)
No recent exacerbation 

## 2022-10-08 NOTE — Assessment & Plan Note (Signed)
Tolerating statin, encouraged heart healthy diet, avoid trans fats, minimize simple carbs and saturated fats. Increase exercise as tolerated 

## 2022-10-10 LAB — DRUG MONITORING, PANEL 8 WITH CONFIRMATION, URINE
6 Acetylmorphine: NEGATIVE ng/mL (ref <10)
Alcohol Metabolites: NEGATIVE ng/mL (ref <500)
Alphahydroxyalprazolam: 139 ng/mL — ABNORMAL HIGH (ref <25)
Alphahydroxymidazolam: NEGATIVE ng/mL (ref <50)
Alphahydroxytriazolam: NEGATIVE ng/mL (ref <50)
Aminoclonazepam: NEGATIVE ng/mL (ref <25)
Amphetamines: NEGATIVE ng/mL (ref <500)
Benzodiazepines: POSITIVE ng/mL — AB (ref <100)
Buprenorphine, Urine: NEGATIVE ng/mL (ref <5)
Cocaine Metabolite: NEGATIVE ng/mL (ref <150)
Creatinine: 92.2 mg/dL (ref >=20.0)
Hydroxyethylflurazepam: NEGATIVE ng/mL (ref <50)
Lorazepam: NEGATIVE ng/mL (ref <50)
MDMA: NEGATIVE ng/mL (ref <500)
Marijuana Metabolite: NEGATIVE ng/mL (ref <20)
Nordiazepam: NEGATIVE ng/mL (ref <50)
Opiates: NEGATIVE ng/mL (ref <100)
Oxazepam: 3478 ng/mL — ABNORMAL HIGH (ref <50)
Oxidant: NEGATIVE ug/mL (ref <200)
Oxycodone: NEGATIVE ng/mL (ref <100)
Temazepam: >6250 ng/mL — ABNORMAL HIGH (ref <50)
pH: 7.3 (ref 4.5–9.0)

## 2022-10-10 LAB — DM TEMPLATE

## 2022-10-11 NOTE — Telephone Encounter (Signed)
Letter mailed out.

## 2022-11-03 ENCOUNTER — Other Ambulatory Visit: Payer: Self-pay | Admitting: Family Medicine

## 2022-11-26 ENCOUNTER — Other Ambulatory Visit: Payer: Self-pay | Admitting: Family Medicine

## 2022-12-03 ENCOUNTER — Other Ambulatory Visit: Payer: Self-pay | Admitting: Family Medicine

## 2022-12-03 ENCOUNTER — Telehealth: Payer: Self-pay | Admitting: Family Medicine

## 2022-12-03 MED ORDER — NABUMETONE 750 MG PO TABS: ORAL_TABLET | ORAL | 1 refills | Status: DC

## 2022-12-03 NOTE — Telephone Encounter (Signed)
Karena Addison (spouse) called back to follow up on status and advised that they were looking to get a refill with the new script as well.

## 2022-12-03 NOTE — Telephone Encounter (Signed)
Patient wife called in and wanted provider to up quantity of Nabumetone 750 mg due to him having to take 2 pills a day. Please advise

## 2023-02-14 ENCOUNTER — Telehealth: Payer: Self-pay

## 2023-02-14 NOTE — Telephone Encounter (Signed)
Called pt was advised Stick with once daily prn or he can return to ortho and discuss with them. Spoke with Pt ife  Stated understand and would wait and talk at next visit.

## 2023-02-14 NOTE — Telephone Encounter (Signed)
  Patient wife called in and wanted provider to up quantity of Nabumetone 750 mg due to him having to take 2 pills a day. Please Advised   He has been on in the past.

## 2023-02-14 NOTE — Telephone Encounter (Signed)
Called pt was advised  

## 2023-02-16 DIAGNOSIS — H40013 Open angle with borderline findings, low risk, bilateral: Secondary | ICD-10-CM | POA: Diagnosis not present

## 2023-02-16 DIAGNOSIS — H353111 Nonexudative age-related macular degeneration, right eye, early dry stage: Secondary | ICD-10-CM | POA: Diagnosis not present

## 2023-02-16 DIAGNOSIS — Z961 Presence of intraocular lens: Secondary | ICD-10-CM | POA: Diagnosis not present

## 2023-02-16 DIAGNOSIS — H52202 Unspecified astigmatism, left eye: Secondary | ICD-10-CM | POA: Diagnosis not present

## 2023-03-23 DIAGNOSIS — C44519 Basal cell carcinoma of skin of other part of trunk: Secondary | ICD-10-CM | POA: Diagnosis not present

## 2023-03-23 DIAGNOSIS — L570 Actinic keratosis: Secondary | ICD-10-CM | POA: Diagnosis not present

## 2023-03-23 DIAGNOSIS — L578 Other skin changes due to chronic exposure to nonionizing radiation: Secondary | ICD-10-CM | POA: Diagnosis not present

## 2023-03-23 DIAGNOSIS — L821 Other seborrheic keratosis: Secondary | ICD-10-CM | POA: Diagnosis not present

## 2023-03-23 DIAGNOSIS — Z85828 Personal history of other malignant neoplasm of skin: Secondary | ICD-10-CM | POA: Diagnosis not present

## 2023-03-23 DIAGNOSIS — W908XXS Exposure to other nonionizing radiation, sequela: Secondary | ICD-10-CM | POA: Diagnosis not present

## 2023-04-05 ENCOUNTER — Other Ambulatory Visit: Payer: Self-pay | Admitting: Family Medicine

## 2023-04-05 ENCOUNTER — Telehealth: Payer: Self-pay | Admitting: Family Medicine

## 2023-04-05 MED ORDER — TEMAZEPAM 30 MG PO CAPS: 30.0000 mg | ORAL_CAPSULE | Freq: Every evening | ORAL | 0 refills | Status: DC | PRN

## 2023-04-05 MED ORDER — TEMAZEPAM 30 MG PO CAPS: 30.0000 mg | ORAL_CAPSULE | Freq: Every evening | ORAL | 5 refills | Status: DC | PRN

## 2023-04-05 NOTE — Telephone Encounter (Signed)
Patient spouse called and would like to know why temazepam (RESTORIL) 30 MG capsule was denied. Please call

## 2023-04-06 NOTE — Telephone Encounter (Signed)
Karena Addison called back to f/u. Please advise.

## 2023-04-07 ENCOUNTER — Telehealth: Payer: Self-pay | Admitting: Family Medicine

## 2023-04-07 NOTE — Telephone Encounter (Signed)
Rx sent in on 04/05/23

## 2023-04-07 NOTE — Telephone Encounter (Signed)
Requesting: ALPRAZolam (XANAX) 0.5 MG tablet  Contract: 04/10/2021 (maybe not scanned a more recent one?) UDS: 10/08/2022 Last Visit: 10/07/2022 Next Visit: 05/19/2023 Last Refill: 10/07/2022 #60 with 5 refills  Please Advise

## 2023-04-08 NOTE — Telephone Encounter (Signed)
Pt's wife called back to check status of rx. Please advise.

## 2023-04-08 NOTE — Telephone Encounter (Signed)
Called pt wife was advised medication was sent.

## 2023-04-12 ENCOUNTER — Telehealth: Payer: Self-pay

## 2023-04-13 NOTE — Telephone Encounter (Signed)
Message sent to PA team.

## 2023-04-14 ENCOUNTER — Encounter: Payer: Medicare HMO | Admitting: Family Medicine

## 2023-04-19 ENCOUNTER — Other Ambulatory Visit (HOSPITAL_COMMUNITY): Payer: Self-pay

## 2023-04-19 ENCOUNTER — Telehealth: Payer: Self-pay | Admitting: Pharmacist

## 2023-04-19 NOTE — Telephone Encounter (Signed)
Pharmacy Patient Advocate Encounter   Received notification from CoverMyMeds that prior authorization for Temazepam 30MG  capsules is required/requested.   Insurance verification completed.   The patient is insured through [facility] .   Per test claim: PA required; PA submitted to [facility] via CoverMyMeds Key/confirmation #/EOC [id number] Status is pending

## 2023-04-19 NOTE — Telephone Encounter (Signed)
Apologies for delayed response, PA request has been Submitted. New Encounter created for follow up. For additional info see Pharmacy Prior Auth telephone encounter from 04/19/2023.

## 2023-04-20 NOTE — Telephone Encounter (Signed)
Pharmacy Patient Advocate Encounter  Received notification from [employer] that Prior Authorization for Temazepam 30MG  capsules has been DENIED.  Full denial letter will be uploaded to the media tab. See denial reason below.  Your plan does not allow coverage of this medication based on your prescriber answering No to the following question(s): Is the requested drug being prescribed for the short-term treatment of insomnia?  PA #/Case ID/Reference #: [id number]  Please be advised we currently do not have a Pharmacist to review denials, therefore you will need to process appeals accordingly as needed. Thanks for your support at this time. Contact for appeals are as follows: Phone: [phone], Fax: [phone]

## 2023-04-27 NOTE — Telephone Encounter (Signed)
Called pt lvm need to discuss Insurance Denied Jerome Craig and Dr.Blyth  said can discuss at office visit or try Good Rx.

## 2023-05-08 ENCOUNTER — Other Ambulatory Visit: Payer: Self-pay | Admitting: Family Medicine

## 2023-05-09 NOTE — Telephone Encounter (Signed)
Requesting: temazepam 30mg   Contract: 04/10/21 UDS: 10/08/22 Last Visit: 10/07/22 Next Visit:  05/19/23 Last Refill: 04/05/23 #30 and 0RF   Please Advise

## 2023-05-18 NOTE — Assessment & Plan Note (Signed)
No recent exacerbation 

## 2023-05-18 NOTE — Assessment & Plan Note (Signed)
Tolerating statin, encouraged heart healthy diet, avoid trans fats, minimize simple carbs and saturated fats. Increase exercise as tolerated 

## 2023-05-18 NOTE — Assessment & Plan Note (Signed)
Well controlled, no changes to meds. Encouraged heart healthy diet such as the DASH diet and exercise as tolerated.  °

## 2023-05-19 ENCOUNTER — Ambulatory Visit (INDEPENDENT_AMBULATORY_CARE_PROVIDER_SITE_OTHER): Payer: Medicare HMO | Admitting: Family Medicine

## 2023-05-19 VITALS — BP 138/70 | HR 62 | Temp 97.9°F | Resp 16 | Ht 71.0 in | Wt 172.6 lb

## 2023-05-19 DIAGNOSIS — I10 Essential (primary) hypertension: Secondary | ICD-10-CM | POA: Diagnosis not present

## 2023-05-19 DIAGNOSIS — Z87891 Personal history of nicotine dependence: Secondary | ICD-10-CM

## 2023-05-19 DIAGNOSIS — J449 Chronic obstructive pulmonary disease, unspecified: Secondary | ICD-10-CM | POA: Diagnosis not present

## 2023-05-19 DIAGNOSIS — Z79899 Other long term (current) drug therapy: Secondary | ICD-10-CM | POA: Diagnosis not present

## 2023-05-19 DIAGNOSIS — E7800 Pure hypercholesterolemia, unspecified: Secondary | ICD-10-CM | POA: Diagnosis not present

## 2023-05-19 DIAGNOSIS — F411 Generalized anxiety disorder: Secondary | ICD-10-CM

## 2023-05-19 MED ORDER — FEXOFENADINE HCL 180 MG PO TABS: 180.0000 mg | ORAL_TABLET | Freq: Every day | ORAL | 1 refills | Status: DC

## 2023-05-19 MED ORDER — FAMOTIDINE 40 MG PO TABS: 40.0000 mg | ORAL_TABLET | Freq: Every day | ORAL | 1 refills | Status: DC

## 2023-05-19 MED ORDER — METOPROLOL SUCCINATE ER 50 MG PO TB24: ORAL_TABLET | ORAL | 1 refills | Status: DC

## 2023-05-19 MED ORDER — ATORVASTATIN CALCIUM 10 MG PO TABS: 5.0000 mg | ORAL_TABLET | Freq: Every day | ORAL | 1 refills | Status: DC

## 2023-05-19 MED ORDER — TEMAZEPAM 30 MG PO CAPS: 30.0000 mg | ORAL_CAPSULE | Freq: Every day | ORAL | 5 refills | Status: DC

## 2023-05-19 MED ORDER — NABUMETONE 750 MG PO TABS: 750.0000 mg | ORAL_TABLET | Freq: Two times a day (BID) | ORAL | 1 refills | Status: DC | PRN

## 2023-05-19 MED ORDER — ALPRAZOLAM 0.5 MG PO TABS: ORAL_TABLET | ORAL | 5 refills | Status: DC

## 2023-05-19 MED ORDER — HYDROCHLOROTHIAZIDE 12.5 MG PO CAPS: 12.5000 mg | ORAL_CAPSULE | Freq: Every day | ORAL | 1 refills | Status: DC

## 2023-05-19 NOTE — Patient Instructions (Signed)
Insomnia Insomnia is a sleep disorder that makes it difficult to fall asleep or stay asleep. Insomnia can cause fatigue, low energy, difficulty concentrating, mood swings, and poor performance at work or school. There are three different ways to classify insomnia: Difficulty falling asleep. Difficulty staying asleep. Waking up too early in the morning. Any type of insomnia can be long-term (chronic) or short-term (acute). Both are common. Short-term insomnia usually lasts for 3 months or less. Chronic insomnia occurs at least three times a week for longer than 3 months. What are the causes? Insomnia may be caused by another condition, situation, or substance, such as: Having certain mental health conditions, such as anxiety and depression. Using caffeine, alcohol, tobacco, or drugs. Having gastrointestinal conditions, such as gastroesophageal reflux disease (GERD). Having certain medical conditions. These include: Asthma. Alzheimer's disease. Stroke. Chronic pain. An overactive thyroid gland (hyperthyroidism). Other sleep disorders, such as restless legs syndrome and sleep apnea. Menopause. Sometimes, the cause of insomnia may not be known. What increases the risk? Risk factors for insomnia include: Gender. Females are affected more often than males. Age. Insomnia is more common as people get older. Stress and certain medical and mental health conditions. Lack of exercise. Having an irregular work schedule. This may include working night shifts and traveling between different time zones. What are the signs or symptoms? If you have insomnia, the main symptom is having trouble falling asleep or having trouble staying asleep. This may lead to other symptoms, such as: Feeling tired or having low energy. Feeling nervous about going to sleep. Not feeling rested in the morning. Having trouble concentrating. Feeling irritable, anxious, or depressed. How is this diagnosed? This condition  may be diagnosed based on: Your symptoms and medical history. Your health care provider may ask about: Your sleep habits. Any medical conditions you have. Your mental health. A physical exam. How is this treated? Treatment for insomnia depends on the cause. Treatment may focus on treating an underlying condition that is causing the insomnia. Treatment may also include: Medicines to help you sleep. Counseling or therapy. Lifestyle adjustments to help you sleep better. Follow these instructions at home: Eating and drinking  Limit or avoid alcohol, caffeinated beverages, and products that contain nicotine and tobacco, especially close to bedtime. These can disrupt your sleep. Do not eat a large meal or eat spicy foods right before bedtime. This can lead to digestive discomfort that can make it hard for you to sleep. Sleep habits  Keep a sleep diary to help you and your health care provider figure out what could be causing your insomnia. Write down: When you sleep. When you wake up during the night. How well you sleep and how rested you feel the next day. Any side effects of medicines you are taking. What you eat and drink. Make your bedroom a dark, comfortable place where it is easy to fall asleep. Put up shades or blackout curtains to block light from outside. Use a white noise machine to block noise. Keep the temperature cool. Limit screen use before bedtime. This includes: Not watching TV. Not using your smartphone, tablet, or computer. Stick to a routine that includes going to bed and waking up at the same times every day and night. This can help you fall asleep faster. Consider making a quiet activity, such as reading, part of your nighttime routine. Try to avoid taking naps during the day so that you sleep better at night. Get out of bed if you are still awake after   15 minutes of trying to sleep. Keep the lights down, but try reading or doing a quiet activity. When you feel  sleepy, go back to bed. General instructions Take over-the-counter and prescription medicines only as told by your health care provider. Exercise regularly as told by your health care provider. However, avoid exercising in the hours right before bedtime. Use relaxation techniques to manage stress. Ask your health care provider to suggest some techniques that may work well for you. These may include: Breathing exercises. Routines to release muscle tension. Visualizing peaceful scenes. Make sure that you drive carefully. Do not drive if you feel very sleepy. Keep all follow-up visits. This is important. Contact a health care provider if: You are tired throughout the day. You have trouble in your daily routine due to sleepiness. You continue to have sleep problems, or your sleep problems get worse. Get help right away if: You have thoughts about hurting yourself or someone else. Get help right away if you feel like you may hurt yourself or others, or have thoughts about taking your own life. Go to your nearest emergency room or: Call 911. Call the National Suicide Prevention Lifeline at 1-800-273-8255 or 988. This is open 24 hours a day. Text the Crisis Text Line at 741741. Summary Insomnia is a sleep disorder that makes it difficult to fall asleep or stay asleep. Insomnia can be long-term (chronic) or short-term (acute). Treatment for insomnia depends on the cause. Treatment may focus on treating an underlying condition that is causing the insomnia. Keep a sleep diary to help you and your health care provider figure out what could be causing your insomnia. This information is not intended to replace advice given to you by your health care provider. Make sure you discuss any questions you have with your health care provider. Document Revised: 06/15/2021 Document Reviewed: 06/15/2021 Elsevier Patient Education  2024 Elsevier Inc.  

## 2023-05-20 ENCOUNTER — Encounter: Payer: Self-pay | Admitting: *Deleted

## 2023-05-20 ENCOUNTER — Encounter: Payer: Self-pay | Admitting: Family Medicine

## 2023-05-20 LAB — LIPID PANEL
Cholesterol: 171 mg/dL (ref 0–200)
HDL: 48.5 mg/dL (ref >39.00)
LDL Cholesterol: 100 mg/dL — ABNORMAL HIGH (ref 0–99)
NonHDL: 122.83
Total CHOL/HDL Ratio: 4
Triglycerides: 114 mg/dL (ref 0.0–149.0)
VLDL: 22.8 mg/dL (ref 0.0–40.0)

## 2023-05-20 LAB — COMPREHENSIVE METABOLIC PANEL
ALT: 10 U/L (ref 0–53)
AST: 18 U/L (ref 0–37)
Albumin: 4.3 g/dL (ref 3.5–5.2)
Alkaline Phosphatase: 91 U/L (ref 39–117)
BUN: 25 mg/dL — ABNORMAL HIGH (ref 6–23)
CO2: 30 meq/L (ref 19–32)
Calcium: 9.2 mg/dL (ref 8.4–10.5)
Chloride: 100 meq/L (ref 96–112)
Creatinine, Ser: 1.29 mg/dL (ref 0.40–1.50)
GFR: 52.01 mL/min — ABNORMAL LOW (ref >60.00)
Glucose, Bld: 83 mg/dL (ref 70–99)
Potassium: 4.7 meq/L (ref 3.5–5.1)
Sodium: 139 meq/L (ref 135–145)
Total Bilirubin: 1.1 mg/dL (ref 0.2–1.2)
Total Protein: 7.6 g/dL (ref 6.0–8.3)

## 2023-05-20 LAB — CBC WITH DIFFERENTIAL/PLATELET
Basophils Absolute: 0.1 10*3/uL (ref 0.0–0.1)
Basophils Relative: 0.8 % (ref 0.0–3.0)
Eosinophils Absolute: 0.6 10*3/uL (ref 0.0–0.7)
Eosinophils Relative: 7.2 % — ABNORMAL HIGH (ref 0.0–5.0)
HCT: 40.2 % (ref 39.0–52.0)
Hemoglobin: 13.2 g/dL (ref 13.0–17.0)
Lymphocytes Relative: 24.6 % (ref 12.0–46.0)
Lymphs Abs: 2.1 10*3/uL (ref 0.7–4.0)
MCHC: 32.8 g/dL (ref 30.0–36.0)
MCV: 94.9 fL (ref 78.0–100.0)
Monocytes Absolute: 0.9 10*3/uL (ref 0.1–1.0)
Monocytes Relative: 10.3 % (ref 3.0–12.0)
Neutro Abs: 4.8 10*3/uL (ref 1.4–7.7)
Neutrophils Relative %: 57.1 % (ref 43.0–77.0)
Platelets: 190 10*3/uL (ref 150.0–400.0)
RBC: 4.24 Mil/uL (ref 4.22–5.81)
RDW: 13.2 % (ref 11.5–15.5)
WBC: 8.4 10*3/uL (ref 4.0–10.5)
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
## 2023-05-21 LAB — DRUG MONITORING PANEL 376104, URINE
Alphahydroxyalprazolam: 211 ng/mL — ABNORMAL HIGH (ref <25)
Alphahydroxymidazolam: NEGATIVE ng/mL (ref <50)
Alphahydroxytriazolam: NEGATIVE ng/mL (ref <50)
Aminoclonazepam: NEGATIVE ng/mL (ref <25)
Amphetamines: NEGATIVE ng/mL (ref <500)
Barbiturates: NEGATIVE ng/mL (ref <300)
Benzodiazepines: POSITIVE ng/mL — AB (ref <100)
Cocaine Metabolite: NEGATIVE ng/mL (ref <150)
Desmethyltramadol: NEGATIVE ng/mL (ref <100)
Hydroxyethylflurazepam: NEGATIVE ng/mL (ref <50)
Lorazepam: NEGATIVE ng/mL (ref <50)
Nordiazepam: NEGATIVE ng/mL (ref <50)
Opiates: NEGATIVE ng/mL (ref <100)
Oxazepam: 3976 ng/mL — ABNORMAL HIGH (ref <50)
Oxycodone: NEGATIVE ng/mL (ref <100)
Temazepam: >6250 ng/mL — ABNORMAL HIGH (ref <50)
Tramadol: NEGATIVE ng/mL (ref <100)

## 2023-05-21 LAB — DM TEMPLATE

## 2023-05-30 ENCOUNTER — Encounter: Payer: Self-pay | Admitting: Emergency Medicine

## 2023-05-30 ENCOUNTER — Other Ambulatory Visit: Payer: Self-pay | Admitting: Family Medicine

## 2023-05-30 ENCOUNTER — Telehealth: Payer: Self-pay | Admitting: Emergency Medicine

## 2023-05-30 DIAGNOSIS — Z87891 Personal history of nicotine dependence: Secondary | ICD-10-CM

## 2023-05-30 DIAGNOSIS — J449 Chronic obstructive pulmonary disease, unspecified: Secondary | ICD-10-CM

## 2023-05-30 DIAGNOSIS — R059 Cough, unspecified: Secondary | ICD-10-CM

## 2023-05-30 NOTE — Telephone Encounter (Signed)
Patient notified of note below and wife would like to see if patient can have a chest xray

## 2023-05-30 NOTE — Telephone Encounter (Signed)
Bradd Canary, MD  Jenna Luo, RN; Marian Sorrow D, CMA; Thelma Barge D, CMA Thanks, I will have my staff let him know he is not a candidate

## 2023-05-31 NOTE — Telephone Encounter (Signed)
Called patient and informed him Chest x ray has been ordered. Stated "He will go and get it done next week"

## 2023-06-30 ENCOUNTER — Ambulatory Visit (HOSPITAL_BASED_OUTPATIENT_CLINIC_OR_DEPARTMENT_OTHER)
Admission: RE | Admit: 2023-06-30 | Discharge: 2023-06-30 | Disposition: A | Discharge status: Home / Self Care | Payer: Medicare HMO | Source: Ambulatory Visit | Attending: Family Medicine | Admitting: Family Medicine

## 2023-06-30 DIAGNOSIS — J449 Chronic obstructive pulmonary disease, unspecified: Secondary | ICD-10-CM | POA: Diagnosis not present

## 2023-06-30 DIAGNOSIS — Z87891 Personal history of nicotine dependence: Secondary | ICD-10-CM | POA: Insufficient documentation

## 2023-06-30 DIAGNOSIS — R059 Cough, unspecified: Secondary | ICD-10-CM | POA: Diagnosis not present

## 2023-06-30 DIAGNOSIS — J439 Emphysema, unspecified: Secondary | ICD-10-CM | POA: Diagnosis not present

## 2023-09-21 DIAGNOSIS — L578 Other skin changes due to chronic exposure to nonionizing radiation: Secondary | ICD-10-CM | POA: Diagnosis not present

## 2023-09-21 DIAGNOSIS — L821 Other seborrheic keratosis: Secondary | ICD-10-CM | POA: Diagnosis not present

## 2023-09-21 DIAGNOSIS — L570 Actinic keratosis: Secondary | ICD-10-CM | POA: Diagnosis not present

## 2023-09-21 DIAGNOSIS — Z85828 Personal history of other malignant neoplasm of skin: Secondary | ICD-10-CM | POA: Diagnosis not present

## 2023-09-21 DIAGNOSIS — L720 Epidermal cyst: Secondary | ICD-10-CM | POA: Diagnosis not present

## 2023-10-27 ENCOUNTER — Ambulatory Visit: Admitting: Family Medicine

## 2023-10-27 ENCOUNTER — Ambulatory Visit (HOSPITAL_BASED_OUTPATIENT_CLINIC_OR_DEPARTMENT_OTHER)
Admission: RE | Admit: 2023-10-27 | Discharge: 2023-10-27 | Disposition: A | Discharge status: Home / Self Care | Source: Ambulatory Visit | Attending: Family Medicine | Admitting: Family Medicine

## 2023-10-27 ENCOUNTER — Ambulatory Visit: Payer: Self-pay

## 2023-10-27 ENCOUNTER — Encounter: Payer: Self-pay | Admitting: Family Medicine

## 2023-10-27 VITALS — BP 130/78 | HR 87 | Temp 97.6°F | Resp 20 | Ht 71.0 in | Wt 172.6 lb

## 2023-10-27 DIAGNOSIS — R051 Acute cough: Secondary | ICD-10-CM | POA: Insufficient documentation

## 2023-10-27 DIAGNOSIS — I700 Atherosclerosis of aorta: Secondary | ICD-10-CM | POA: Diagnosis not present

## 2023-10-27 DIAGNOSIS — J449 Chronic obstructive pulmonary disease, unspecified: Secondary | ICD-10-CM | POA: Diagnosis not present

## 2023-10-27 DIAGNOSIS — R509 Fever, unspecified: Secondary | ICD-10-CM | POA: Insufficient documentation

## 2023-10-27 DIAGNOSIS — N390 Urinary tract infection, site not specified: Secondary | ICD-10-CM | POA: Diagnosis not present

## 2023-10-27 DIAGNOSIS — R059 Cough, unspecified: Secondary | ICD-10-CM | POA: Diagnosis not present

## 2023-10-27 DIAGNOSIS — R350 Frequency of micturition: Secondary | ICD-10-CM

## 2023-10-27 DIAGNOSIS — I771 Stricture of artery: Secondary | ICD-10-CM | POA: Diagnosis not present

## 2023-10-27 LAB — POC COVID19 BINAXNOW: SARS Coronavirus 2 Ag: NEGATIVE

## 2023-10-27 LAB — POC URINALSYSI DIPSTICK (AUTOMATED)
Glucose, UA: NEGATIVE
Nitrite, UA: POSITIVE
Protein, UA: POSITIVE — AB
Spec Grav, UA: 1.015 (ref 1.010–1.025)
Urobilinogen, UA: 0.2 U/dL
pH, UA: 6 (ref 5.0–8.0)

## 2023-10-27 LAB — POC INFLUENZA A&B (BINAX/QUICKVUE)
Influenza A, POC: NEGATIVE
Influenza B, POC: NEGATIVE

## 2023-10-27 MED ORDER — CIPROFLOXACIN HCL 500 MG PO TABS: 500.0000 mg | ORAL_TABLET | Freq: Two times a day (BID) | ORAL | 0 refills | Status: AC

## 2023-10-27 MED ORDER — TRELEGY ELLIPTA 100-62.5-25 MCG/ACT IN AEPB: 1.0000 | INHALATION_SPRAY | Freq: Every day | RESPIRATORY_TRACT | 11 refills | Status: DC

## 2023-10-27 MED ORDER — AZITHROMYCIN 250 MG PO TABS: ORAL_TABLET | ORAL | 0 refills | Status: DC
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
## 2023-10-27 NOTE — Telephone Encounter (Signed)
  Chief Complaint: fever Symptoms: fever highest 102, today 101, body aches, chills, wheezing, urinary frequency, foot pain Frequency: Monday night  Pertinent Negatives: Patient denies SOB or cough/congestion Disposition: [] ED /[] Urgent Care (no appt availability in office) / [x] Appointment(In office/virtual)/ []  [place] [facility]/ [] Home Care/ [] Refused Recommended Disposition /[] [place] [facility]/ []  Follow-up with PCP Additional Notes: spoke with pt's wife Karena Addison. She states pt sx started Monday night. Has been alternating Tylenol and Advil for fever. Pt has agreed to come in and be seen today, no appts with PCP. Offered appts today with different providers. Pt wife preferred Dr. Zola Button today at 340. Scheduled appt.   Copied from CRM [id number]. Topic: Appointments - Appointment Scheduling >> Oct 27, 2023 11:45 AM Raul Del wrote: Spoke with the patient's wife, Erle Guster. She stated that the patient has been feeling very ill for the past 3 days. His condition has been worsening, and she is concerned he will not improve. He had a fever on Monday and Tuesday, and as of last night, his fever reached 102F. He is currently experiencing wheezing. She performed a COVID test last night, which came back negative. Callback [phone] Reason for Disposition  [1] Fever > 101 F (38.3 C) AND [2] age > 60 years  Answer Assessment - Initial Assessment Questions 1. TEMPERATURE: "What is the most recent temperature?"  "How was it measured?"      101 2. ONSET: "When did the fever start?"      Monday night  3. CHILLS: "Do you have chills?" If yes: "How bad are they?"  (e.g., none, mild, moderate, severe)   - NONE: no chills   - MILD: feeling cold   - MODERATE: feeling very cold, some shivering (feels better under a thick blanket)   - SEVERE: feeling extremely cold with shaking chills (general body shaking, rigors; even under a thick blanket)      mild 4. OTHER SYMPTOMS: "Do you have any  other symptoms besides the fever?"  (e.g., abdomen pain, cough, diarrhea, earache, headache, sore throat, urination pain)     Body aches, not feeling well, wheezing, having foot pain, urinary frequency  7. TREATMENT: "What have you done so far to treat this fever?" (e.g., medications)     Tylenol and Advil  Protocols used: [facility]

## 2023-10-28 DIAGNOSIS — R350 Frequency of micturition: Secondary | ICD-10-CM | POA: Diagnosis not present

## 2023-10-28 LAB — CBC WITH DIFFERENTIAL/PLATELET
Basophils Absolute: 0.1 10*3/uL (ref 0.0–0.1)
Basophils Relative: 0.5 % (ref 0.0–3.0)
Eosinophils Absolute: 0 10*3/uL (ref 0.0–0.7)
Eosinophils Relative: 0.4 % (ref 0.0–5.0)
HCT: 38.4 % — ABNORMAL LOW (ref 39.0–52.0)
Hemoglobin: 13.3 g/dL (ref 13.0–17.0)
Lymphocytes Relative: 6.6 % — ABNORMAL LOW (ref 12.0–46.0)
Lymphs Abs: 0.7 10*3/uL (ref 0.7–4.0)
MCHC: 34.7 g/dL (ref 30.0–36.0)
MCV: 93.2 fl (ref 78.0–100.0)
Monocytes Absolute: 1.6 10*3/uL — ABNORMAL HIGH (ref 0.1–1.0)
Monocytes Relative: 15.7 % — ABNORMAL HIGH (ref 3.0–12.0)
Neutro Abs: 7.6 10*3/uL (ref 1.4–7.7)
Neutrophils Relative %: 76.8 % (ref 43.0–77.0)
Platelets: 124 10*3/uL — ABNORMAL LOW (ref 150.0–400.0)
RBC: 4.12 Mil/uL — ABNORMAL LOW (ref 4.22–5.81)
RDW: 13.6 % (ref 11.5–15.5)
WBC: 9.9 10*3/uL (ref 4.0–10.5)

## 2023-10-28 LAB — COMPREHENSIVE METABOLIC PANEL WITH GFR
ALT: 26 U/L (ref 0–53)
AST: 36 U/L (ref 0–37)
Albumin: 4.1 g/dL (ref 3.5–5.2)
Alkaline Phosphatase: 74 U/L (ref 39–117)
BUN: 38 mg/dL — ABNORMAL HIGH (ref 6–23)
CO2: 27 meq/L (ref 19–32)
Calcium: 8.8 mg/dL (ref 8.4–10.5)
Chloride: 98 meq/L (ref 96–112)
Creatinine, Ser: 2.14 mg/dL — ABNORMAL HIGH (ref 0.40–1.50)
GFR: 28.25 mL/min — ABNORMAL LOW (ref >60.00)
Glucose, Bld: 120 mg/dL — ABNORMAL HIGH (ref 70–99)
Potassium: 4.1 meq/L (ref 3.5–5.1)
Sodium: 137 meq/L (ref 135–145)
Total Bilirubin: 1.5 mg/dL — ABNORMAL HIGH (ref 0.2–1.2)
Total Protein: 7.4 g/dL (ref 6.0–8.3)

## 2023-10-29 LAB — URINE CULTURE
MICRO NUMBER:: 16319210
Result:: NO GROWTH
SPECIMEN QUALITY:: ADEQUATE

## 2023-11-07 ENCOUNTER — Telehealth: Payer: Self-pay

## 2023-11-07 NOTE — Telephone Encounter (Signed)
 Copied from CRM [id number]. Topic: Clinical - Request for Lab/Test Order >> Nov 07, 2023  9:28 AM Kita Perish H wrote: Reason for CRM: Patients wife called  to cancel appointment for tomorrow states he's feeling better and finished medicine yesterday for UTI but wants to come in for a urine sample to make sure everything is okay, please reach out to patient, thanks.  Scott [phone]    Patient's appointment was already cancelled.

## 2023-11-08 ENCOUNTER — Ambulatory Visit: Admitting: Family Medicine

## 2023-11-22 ENCOUNTER — Encounter: Payer: Medicare HMO | Admitting: Family Medicine

## 2023-11-22 ENCOUNTER — Ambulatory Visit (INDEPENDENT_AMBULATORY_CARE_PROVIDER_SITE_OTHER): Payer: Medicare HMO | Admitting: Family

## 2023-11-22 VITALS — BP 138/82 | HR 71 | Temp 98.9°F | Resp 16 | Ht 71.0 in | Wt 168.0 lb

## 2023-11-22 DIAGNOSIS — J449 Chronic obstructive pulmonary disease, unspecified: Secondary | ICD-10-CM

## 2023-11-22 DIAGNOSIS — I10 Essential (primary) hypertension: Secondary | ICD-10-CM

## 2023-11-22 DIAGNOSIS — R350 Frequency of micturition: Secondary | ICD-10-CM | POA: Diagnosis not present

## 2023-11-22 DIAGNOSIS — G4700 Insomnia, unspecified: Secondary | ICD-10-CM | POA: Diagnosis not present

## 2023-11-22 DIAGNOSIS — R040 Epistaxis: Secondary | ICD-10-CM | POA: Insufficient documentation

## 2023-11-22 DIAGNOSIS — K219 Gastro-esophageal reflux disease without esophagitis: Secondary | ICD-10-CM | POA: Insufficient documentation

## 2023-11-22 DIAGNOSIS — N1831 Chronic kidney disease, stage 3a: Secondary | ICD-10-CM | POA: Insufficient documentation

## 2023-11-22 DIAGNOSIS — N179 Acute kidney failure, unspecified: Secondary | ICD-10-CM | POA: Insufficient documentation

## 2023-11-22 DIAGNOSIS — E7800 Pure hypercholesterolemia, unspecified: Secondary | ICD-10-CM | POA: Diagnosis not present

## 2023-11-22 DIAGNOSIS — F411 Generalized anxiety disorder: Secondary | ICD-10-CM

## 2023-11-22 DIAGNOSIS — Z79899 Other long term (current) drug therapy: Secondary | ICD-10-CM | POA: Diagnosis not present

## 2023-11-22 DIAGNOSIS — N183 Chronic kidney disease, stage 3 unspecified: Secondary | ICD-10-CM | POA: Insufficient documentation

## 2023-11-22 DIAGNOSIS — F419 Anxiety disorder, unspecified: Secondary | ICD-10-CM

## 2023-11-22 DIAGNOSIS — J309 Allergic rhinitis, unspecified: Secondary | ICD-10-CM | POA: Diagnosis not present

## 2023-11-22 MED ORDER — FAMOTIDINE 40 MG PO TABS: 40.0000 mg | ORAL_TABLET | Freq: Every day | ORAL | 1 refills | Status: DC

## 2023-11-22 MED ORDER — ALPRAZOLAM 0.5 MG PO TABS: ORAL_TABLET | ORAL | 0 refills | Status: DC

## 2023-11-22 MED ORDER — LORATADINE 10 MG PO TABS: 10.0000 mg | ORAL_TABLET | Freq: Every day | ORAL | Status: DC

## 2023-11-22 MED ORDER — ATORVASTATIN CALCIUM 10 MG PO TABS: 5.0000 mg | ORAL_TABLET | Freq: Every day | ORAL | 1 refills | Status: DC

## 2023-11-22 MED ORDER — HYDROCHLOROTHIAZIDE 12.5 MG PO CAPS: 12.5000 mg | ORAL_CAPSULE | Freq: Every day | ORAL | 1 refills | Status: DC

## 2023-11-22 MED ORDER — TRELEGY ELLIPTA 100-62.5-25 MCG/ACT IN AEPB: 1.0000 | INHALATION_SPRAY | Freq: Every day | RESPIRATORY_TRACT | 11 refills | Status: AC

## 2023-11-22 MED ORDER — METOPROLOL SUCCINATE ER 50 MG PO TB24: ORAL_TABLET | ORAL | 1 refills | Status: DC

## 2023-11-22 MED ORDER — ALBUTEROL SULFATE HFA 108 (90 BASE) MCG/ACT IN AERS: 2.0000 | INHALATION_SPRAY | Freq: Four times a day (QID) | RESPIRATORY_TRACT | 0 refills | Status: DC | PRN

## 2023-11-22 MED ORDER — TEMAZEPAM 30 MG PO CAPS: 30.0000 mg | ORAL_CAPSULE | Freq: Every day | ORAL | 0 refills | Status: DC

## 2023-11-22 NOTE — Addendum Note (Signed)
 Addended by: Dorrene Gaucher on: 11/22/2023 04:05 PM   Modules accepted: Level of Service
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
## 2023-11-22 NOTE — Assessment & Plan Note (Signed)
 Not currently using Trelegy.  But did help when he needed it. Does not have albuterol. Will rx.

## 2023-11-22 NOTE — Assessment & Plan Note (Signed)
Stable with pepcid. Continue same.

## 2023-11-22 NOTE — Assessment & Plan Note (Signed)
 Resolved. Offered ENT referral but pt declines unless recurrent.

## 2023-11-22 NOTE — Assessment & Plan Note (Signed)
 BP Readings from Last 3 Encounters:  11/22/23 138/82  10/27/23 130/78  05/19/23 138/70   Maintained on metoprolol  and hydrochlorothiazide .

## 2023-11-22 NOTE — Patient Instructions (Signed)
 VISIT SUMMARY:  During your visit, we discussed your medication management, recent urinary tract infection, and concerns about kidney function. We also addressed your issues with bruising and nosebleeds, and reviewed your current medications and overall health.  YOUR PLAN:  KIDNEY FUNCTION CONCERN: Your kidney function may be affected by illness or dehydration. -Avoid anti-inflammatory medications such as meloxicam and nabumetone . -We will re-evaluate your kidney function in one month. -For pain management, use acetaminophen. Take two extra strength tablets in the morning and at night, with an optional midday dose if needed.  EPISTAXIS (NOSEBLEEDS): Your nosebleeds may be related to nabumetone  use. -Discontinue nabumetone . -If nosebleeds recur, we may refer you to an ENT specialist.  URINARY TRACT INFECTION: You had a recent urinary tract infection treated with antibiotics. Your recent fever may indicate a recurrence. -We will order a urinalysis to check for a UTI recurrence.  CHRONIC OBSTRUCTIVE PULMONARY DISEASE (COPD): Your breathing is well-managed with your current medication regimen. -Continue taking Trelegy as prescribed. -Use an albuterol inhaler as needed for rescue.  HYPERTENSION (HIGH BLOOD PRESSURE): Your blood pressure is well-controlled with your current medications. -Continue taking metoprolol  and hydrochlorothiazide  as prescribed.  ANXIETY: Your anxiety is managed with alprazolam . -Continue taking alprazolam  as prescribed. -We will update your controlled substance contract annually.  ALLERGIES: Your allergies are managed with loratadine. -Continue taking loratadine as needed for allergy symptoms.

## 2023-11-22 NOTE — Assessment & Plan Note (Signed)
 Noted on last lab work. Advised pt to d/c nabumetone . Change to tylenol. Repeat renal function today.

## 2023-11-22 NOTE — Assessment & Plan Note (Signed)
 Takes temazepam . Controlled substance contract is up to date.

## 2023-11-22 NOTE — Assessment & Plan Note (Signed)
 Lab Results  Component Value Date   CHOL 171 05/19/2023   HDL 48.50 05/19/2023   LDLCALC 100 (H) 05/19/2023   TRIG 114.0 05/19/2023   CHOLHDL 4 05/19/2023   LDL maintained on lipitor.

## 2023-11-22 NOTE — Assessment & Plan Note (Signed)
 Controlled substance contract is updated. Will obtain UDS.

## 2023-11-22 NOTE — Assessment & Plan Note (Signed)
 Stable with claritin 10mg . Continue same.

## 2023-11-23 DIAGNOSIS — L0291 Cutaneous abscess, unspecified: Secondary | ICD-10-CM | POA: Diagnosis not present

## 2023-11-23 LAB — URINALYSIS, ROUTINE W REFLEX MICROSCOPIC
Bilirubin Urine: NEGATIVE
Ketones, ur: NEGATIVE
Nitrite: NEGATIVE
Specific Gravity, Urine: 1.02 (ref 1.000–1.030)
Urine Glucose: NEGATIVE
Urobilinogen, UA: 0.2 (ref 0.0–1.0)
pH: 6 (ref 5.0–8.0)

## 2023-11-23 LAB — URINE CULTURE
MICRO NUMBER:: 16419309
Result:: NO GROWTH
SPECIMEN QUALITY:: ADEQUATE

## 2023-11-23 LAB — COMPREHENSIVE METABOLIC PANEL WITH GFR
ALT: 12 U/L (ref 0–53)
AST: 20 U/L (ref 0–37)
Albumin: 4.1 g/dL (ref 3.5–5.2)
Alkaline Phosphatase: 89 U/L (ref 39–117)
BUN: 26 mg/dL — ABNORMAL HIGH (ref 6–23)
CO2: 29 meq/L (ref 19–32)
Calcium: 9.3 mg/dL (ref 8.4–10.5)
Chloride: 98 meq/L (ref 96–112)
Creatinine, Ser: 1.31 mg/dL (ref 0.40–1.50)
GFR: 50.88 mL/min — ABNORMAL LOW (ref >60.00)
Glucose, Bld: 87 mg/dL (ref 70–99)
Potassium: 4.5 meq/L (ref 3.5–5.1)
Sodium: 136 meq/L (ref 135–145)
Total Bilirubin: 1.2 mg/dL (ref 0.2–1.2)
Total Protein: 7.8 g/dL (ref 6.0–8.3)

## 2023-11-24 ENCOUNTER — Encounter: Payer: Self-pay | Admitting: Family

## 2023-11-24 LAB — DRUG MONITORING, PANEL 8 WITH CONFIRMATION, URINE
6 Acetylmorphine: NEGATIVE ng/mL (ref <10)
Alcohol Metabolites: NEGATIVE ng/mL (ref <500)
Alphahydroxyalprazolam: 184 ng/mL — ABNORMAL HIGH (ref <25)
Alphahydroxymidazolam: NEGATIVE ng/mL (ref <50)
Alphahydroxytriazolam: NEGATIVE ng/mL (ref <50)
Aminoclonazepam: NEGATIVE ng/mL (ref <25)
Amphetamines: NEGATIVE ng/mL (ref <500)
Benzodiazepines: POSITIVE ng/mL — AB (ref <100)
Buprenorphine, Urine: NEGATIVE ng/mL (ref <5)
Cocaine Metabolite: NEGATIVE ng/mL (ref <150)
Creatinine: 106.2 mg/dL (ref >=20.0)
Hydroxyethylflurazepam: NEGATIVE ng/mL (ref <50)
Lorazepam: NEGATIVE ng/mL (ref <50)
MDMA: NEGATIVE ng/mL (ref <500)
Marijuana Metabolite: NEGATIVE ng/mL (ref <20)
Nordiazepam: NEGATIVE ng/mL (ref <50)
Opiates: NEGATIVE ng/mL (ref <100)
Oxazepam: 2517 ng/mL — ABNORMAL HIGH (ref <50)
Oxidant: NEGATIVE ug/mL (ref <200)
Oxycodone: NEGATIVE ng/mL (ref <100)
Temazepam: >8000 ng/mL — ABNORMAL HIGH (ref <50)
pH: 5.9 (ref 4.5–9.0)

## 2023-11-24 LAB — DM TEMPLATE

## 2023-11-25 ENCOUNTER — Telehealth: Payer: Self-pay

## 2023-11-25 NOTE — Telephone Encounter (Signed)
 Urine culture is negative for infection.

## 2023-11-25 NOTE — Telephone Encounter (Signed)
 Patient and his wife would like to know about urine sample that was collected.

## 2023-11-28 NOTE — Telephone Encounter (Signed)
 Patient's wife notified of negative results

## 2023-12-28 ENCOUNTER — Ambulatory Visit: Admitting: Family

## 2023-12-28 NOTE — Assessment & Plan Note (Signed)
 No recent acute concern

## 2023-12-28 NOTE — Assessment & Plan Note (Signed)
 Well controlled, no changes to meds. Encouraged heart healthy diet such as the DASH diet and exercise as tolerated.

## 2023-12-28 NOTE — Assessment & Plan Note (Signed)
 Hydrate and monitor

## 2023-12-28 NOTE — Assessment & Plan Note (Signed)
 Tolerating statin, encouraged heart healthy diet, avoid trans fats, minimize simple carbs and saturated fats. Increase exercise as tolerated

## 2023-12-28 NOTE — Assessment & Plan Note (Addendum)
 Encouraged good sleep hygiene such as dark, quiet room. No blue/green glowing lights such as computer screens in bedroom. No alcohol or stimulants in evening. Cut down on caffeine as able. Regular exercise is helpful but not just prior to bed time.  Discussed minimizing Restoril 

## 2023-12-29 ENCOUNTER — Ambulatory Visit (INDEPENDENT_AMBULATORY_CARE_PROVIDER_SITE_OTHER): Admitting: Family Medicine

## 2023-12-29 ENCOUNTER — Encounter: Payer: Self-pay | Admitting: Family Medicine

## 2023-12-29 VITALS — BP 128/78 | HR 60 | Resp 16 | Ht 71.0 in | Wt 169.4 lb

## 2023-12-29 DIAGNOSIS — N183 Chronic kidney disease, stage 3 unspecified: Secondary | ICD-10-CM

## 2023-12-29 DIAGNOSIS — I2510 Atherosclerotic heart disease of native coronary artery without angina pectoris: Secondary | ICD-10-CM | POA: Diagnosis not present

## 2023-12-29 DIAGNOSIS — I10 Essential (primary) hypertension: Secondary | ICD-10-CM | POA: Diagnosis not present

## 2023-12-29 DIAGNOSIS — F419 Anxiety disorder, unspecified: Secondary | ICD-10-CM

## 2023-12-29 DIAGNOSIS — G4700 Insomnia, unspecified: Secondary | ICD-10-CM

## 2023-12-29 DIAGNOSIS — E7800 Pure hypercholesterolemia, unspecified: Secondary | ICD-10-CM | POA: Diagnosis not present

## 2023-12-29 DIAGNOSIS — F411 Generalized anxiety disorder: Secondary | ICD-10-CM

## 2023-12-29 MED ORDER — ALPRAZOLAM 0.5 MG PO TABS: ORAL_TABLET | ORAL | 5 refills | Status: DC

## 2023-12-29 MED ORDER — NABUMETONE 750 MG PO TABS: 750.0000 mg | ORAL_TABLET | Freq: Every day | ORAL | 1 refills | Status: DC | PRN

## 2023-12-29 MED ORDER — TEMAZEPAM 30 MG PO CAPS: 30.0000 mg | ORAL_CAPSULE | Freq: Every day | ORAL | 5 refills | Status: DC

## 2023-12-29 NOTE — Patient Instructions (Addendum)
 5 ounces of water every hour or 10 ounces every 2 hours up to 80 ounces'
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
## 2023-12-30 ENCOUNTER — Ambulatory Visit: Payer: Self-pay | Admitting: Family Medicine

## 2023-12-30 LAB — COMPREHENSIVE METABOLIC PANEL WITH GFR
AG Ratio: 1.3 (calc) (ref 1.0–2.5)
ALT: 10 U/L (ref 9–46)
AST: 19 U/L (ref 10–35)
Albumin: 4.4 g/dL (ref 3.6–5.1)
Alkaline phosphatase (APISO): 82 U/L (ref 35–144)
BUN/Creatinine Ratio: 13 (calc) (ref 6–22)
BUN: 21 mg/dL (ref 7–25)
CO2: 25 mmol/L (ref 20–32)
Calcium: 9.6 mg/dL (ref 8.6–10.3)
Chloride: 101 mmol/L (ref 98–110)
Creat: 1.56 mg/dL — ABNORMAL HIGH (ref 0.70–1.22)
Globulin: 3.4 g/dL (ref 1.9–3.7)
Glucose, Bld: 95 mg/dL (ref 65–99)
Potassium: 4.7 mmol/L (ref 3.5–5.3)
Sodium: 138 mmol/L (ref 135–146)
Total Bilirubin: 1.3 mg/dL — ABNORMAL HIGH (ref 0.2–1.2)
Total Protein: 7.8 g/dL (ref 6.1–8.1)
eGFR: 44 mL/min/{1.73_m2} — ABNORMAL LOW (ref >=60)

## 2023-12-30 LAB — CBC WITH DIFFERENTIAL/PLATELET
Absolute Lymphocytes: 2067 {cells}/uL (ref 850–3900)
Absolute Monocytes: 692 {cells}/uL (ref 200–950)
Basophils Absolute: 84 {cells}/uL (ref 0–200)
Basophils Relative: 1.1 %
Eosinophils Absolute: 547 {cells}/uL — ABNORMAL HIGH (ref 15–500)
Eosinophils Relative: 7.2 %
HCT: 40.1 % (ref 38.5–50.0)
Hemoglobin: 12.9 g/dL — ABNORMAL LOW (ref 13.2–17.1)
MCH: 30.7 pg (ref 27.0–33.0)
MCHC: 32.2 g/dL (ref 32.0–36.0)
MCV: 95.5 fL (ref 80.0–100.0)
MPV: 10 fL (ref 7.5–12.5)
Monocytes Relative: 9.1 %
Neutro Abs: 4210 {cells}/uL (ref 1500–7800)
Neutrophils Relative %: 55.4 %
Platelets: 196 10*3/uL (ref 140–400)
RBC: 4.2 10*6/uL (ref 4.20–5.80)
RDW: 13.2 % (ref 11.0–15.0)
Total Lymphocyte: 27.2 %
WBC: 7.6 10*3/uL (ref 3.8–10.8)

## 2023-12-30 LAB — LIPID PANEL
Cholesterol: 173 mg/dL (ref <200)
HDL: 54 mg/dL (ref >=40)
LDL Cholesterol (Calc): 101 mg/dL — ABNORMAL HIGH
Non-HDL Cholesterol (Calc): 119 mg/dL (ref <130)
Total CHOL/HDL Ratio: 3.2 (calc) (ref <5.0)
Triglycerides: 85 mg/dL (ref <150)

## 2023-12-30 LAB — TSH: TSH: 0.79 m[IU]/L (ref 0.40–4.50)

## 2024-01-01 NOTE — Assessment & Plan Note (Signed)
 Overall doing well. Uses Alprazolam  prn and is encouraged to minimize use.

## 2024-01-02 NOTE — Telephone Encounter (Signed)
 Copied from CRM [id number]. Topic: Clinical - Lab/Test Results >> Jan 02, 2024 11:57 AM Allyne Areola wrote: Reason for CRM: Patient is returning a call he received from [facility], advised patient of Dr.Blyth's lab result message. Patient understood and had no questions.

## 2024-02-22 DIAGNOSIS — H40013 Open angle with borderline findings, low risk, bilateral: Secondary | ICD-10-CM | POA: Diagnosis not present

## 2024-02-22 DIAGNOSIS — H353111 Nonexudative age-related macular degeneration, right eye, early dry stage: Secondary | ICD-10-CM | POA: Diagnosis not present

## 2024-02-22 DIAGNOSIS — Z961 Presence of intraocular lens: Secondary | ICD-10-CM | POA: Diagnosis not present

## 2024-02-22 DIAGNOSIS — H52203 Unspecified astigmatism, bilateral: Secondary | ICD-10-CM | POA: Diagnosis not present

## 2024-04-15 NOTE — Assessment & Plan Note (Signed)
 Tolerating statin, encouraged heart healthy diet, avoid trans fats, minimize simple carbs and saturated fats. Increase exercise as tolerated

## 2024-04-15 NOTE — Assessment & Plan Note (Signed)
 Hydrate and monitor

## 2024-04-15 NOTE — Assessment & Plan Note (Signed)
 Encouraged good sleep hygiene such as dark, quiet room. No blue/green glowing lights such as computer screens in bedroom. No alcohol or stimulants in evening. Cut down on caffeine as able. Regular exercise is helpful but not just prior to bed time.  Minimize Restoril 

## 2024-04-15 NOTE — Assessment & Plan Note (Signed)
 Well controlled, no changes to meds. Encouraged heart healthy diet such as the DASH diet and exercise as tolerated.

## 2024-04-15 NOTE — Assessment & Plan Note (Signed)
 No recent exacerbation

## 2024-04-16 ENCOUNTER — Ambulatory Visit: Admitting: Family Medicine

## 2024-04-16 ENCOUNTER — Encounter: Payer: Self-pay | Admitting: Family Medicine

## 2024-04-16 VITALS — BP 138/82 | HR 56 | Resp 16 | Ht <= 58 in | Wt 170.2 lb

## 2024-04-16 DIAGNOSIS — N1831 Chronic kidney disease, stage 3a: Secondary | ICD-10-CM

## 2024-04-16 DIAGNOSIS — K219 Gastro-esophageal reflux disease without esophagitis: Secondary | ICD-10-CM

## 2024-04-16 DIAGNOSIS — E7800 Pure hypercholesterolemia, unspecified: Secondary | ICD-10-CM

## 2024-04-16 DIAGNOSIS — R350 Frequency of micturition: Secondary | ICD-10-CM

## 2024-04-16 DIAGNOSIS — J449 Chronic obstructive pulmonary disease, unspecified: Secondary | ICD-10-CM | POA: Diagnosis not present

## 2024-04-16 DIAGNOSIS — G4700 Insomnia, unspecified: Secondary | ICD-10-CM | POA: Diagnosis not present

## 2024-04-16 DIAGNOSIS — I10 Essential (primary) hypertension: Secondary | ICD-10-CM | POA: Diagnosis not present

## 2024-04-16 DIAGNOSIS — F419 Anxiety disorder, unspecified: Secondary | ICD-10-CM

## 2024-04-16 LAB — CBC WITH DIFFERENTIAL/PLATELET
Basophils Absolute: 0.1 K/uL (ref 0.0–0.1)
Basophils Relative: 0.9 % (ref 0.0–3.0)
Eosinophils Absolute: 0.4 K/uL (ref 0.0–0.7)
Eosinophils Relative: 6.8 % — ABNORMAL HIGH (ref 0.0–5.0)
HCT: 42.6 % (ref 39.0–52.0)
Hemoglobin: 14.4 g/dL (ref 13.0–17.0)
Lymphocytes Relative: 24.5 % (ref 12.0–46.0)
Lymphs Abs: 1.5 K/uL (ref 0.7–4.0)
MCHC: 33.9 g/dL (ref 30.0–36.0)
MCV: 92.4 fl (ref 78.0–100.0)
Monocytes Absolute: 0.6 K/uL (ref 0.1–1.0)
Monocytes Relative: 10.5 % (ref 3.0–12.0)
Neutro Abs: 3.5 K/uL (ref 1.4–7.7)
Neutrophils Relative %: 57.3 % (ref 43.0–77.0)
Platelets: 177 K/uL (ref 150.0–400.0)
RBC: 4.62 Mil/uL (ref 4.22–5.81)
RDW: 13.2 % (ref 11.5–15.5)
WBC: 6.2 K/uL (ref 4.0–10.5)

## 2024-04-16 LAB — URINALYSIS, ROUTINE W REFLEX MICROSCOPIC
Bilirubin Urine: NEGATIVE
Ketones, ur: NEGATIVE
Nitrite: NEGATIVE
Specific Gravity, Urine: 1.01 (ref 1.000–1.030)
Total Protein, Urine: NEGATIVE
Urine Glucose: NEGATIVE
Urobilinogen, UA: 0.2 (ref 0.0–1.0)
pH: 6 (ref 5.0–8.0)

## 2024-04-16 LAB — COMPREHENSIVE METABOLIC PANEL WITH GFR
ALT: 12 U/L (ref 0–53)
AST: 20 U/L (ref 0–37)
Albumin: 4.7 g/dL (ref 3.5–5.2)
Alkaline Phosphatase: 96 U/L (ref 39–117)
BUN: 23 mg/dL (ref 6–23)
CO2: 33 meq/L — ABNORMAL HIGH (ref 19–32)
Calcium: 10 mg/dL (ref 8.4–10.5)
Chloride: 99 meq/L (ref 96–112)
Creatinine, Ser: 1.44 mg/dL (ref 0.40–1.50)
GFR: 45.29 mL/min — ABNORMAL LOW (ref >60.00)
Glucose, Bld: 98 mg/dL (ref 70–99)
Potassium: 4.7 meq/L (ref 3.5–5.1)
Sodium: 140 meq/L (ref 135–145)
Total Bilirubin: 1.5 mg/dL — ABNORMAL HIGH (ref 0.2–1.2)
Total Protein: 8 g/dL (ref 6.0–8.3)

## 2024-04-16 LAB — TSH: TSH: 1.18 u[IU]/mL (ref 0.35–5.50)

## 2024-04-16 LAB — LIPID PANEL
Cholesterol: 186 mg/dL (ref 0–200)
HDL: 48.2 mg/dL (ref >39.00)
LDL Cholesterol: 115 mg/dL — ABNORMAL HIGH (ref 0–99)
NonHDL: 137.6
Total CHOL/HDL Ratio: 4
Triglycerides: 114 mg/dL (ref 0.0–149.0)
VLDL: 22.8 mg/dL (ref 0.0–40.0)

## 2024-04-16 LAB — PSA: PSA: 1.87 ng/mL (ref 0.10–4.00)

## 2024-04-16 MED ORDER — ATORVASTATIN CALCIUM 10 MG PO TABS: 5.0000 mg | ORAL_TABLET | Freq: Every day | ORAL | 1 refills | Status: AC

## 2024-04-16 MED ORDER — METOPROLOL SUCCINATE ER 50 MG PO TB24: ORAL_TABLET | ORAL | 1 refills | Status: AC

## 2024-04-16 MED ORDER — TEMAZEPAM 30 MG PO CAPS: 30.0000 mg | ORAL_CAPSULE | Freq: Every day | ORAL | 5 refills | Status: AC

## 2024-04-16 MED ORDER — ALBUTEROL SULFATE HFA 108 (90 BASE) MCG/ACT IN AERS: 2.0000 | INHALATION_SPRAY | Freq: Four times a day (QID) | RESPIRATORY_TRACT | 3 refills | Status: AC | PRN

## 2024-04-16 MED ORDER — NABUMETONE 750 MG PO TABS: 750.0000 mg | ORAL_TABLET | Freq: Every day | ORAL | 1 refills | Status: AC | PRN

## 2024-04-16 MED ORDER — ALPRAZOLAM 0.5 MG PO TABS: ORAL_TABLET | ORAL | 5 refills | Status: AC

## 2024-04-16 MED ORDER — HYDROCHLOROTHIAZIDE 12.5 MG PO CAPS: 12.5000 mg | ORAL_CAPSULE | Freq: Every day | ORAL | 1 refills | Status: AC

## 2024-04-16 MED ORDER — LORATADINE 10 MG PO TABS: 10.0000 mg | ORAL_TABLET | Freq: Every day | ORAL | Status: AC

## 2024-04-16 MED ORDER — FAMOTIDINE 40 MG PO TABS: 40.0000 mg | ORAL_TABLET | Freq: Every day | ORAL | 1 refills | Status: AC

## 2024-04-16 NOTE — Patient Instructions (Signed)
 COPD and Physical Activity Chronic obstructive pulmonary disease (COPD) is a long-term, or chronic, condition that affects the lungs. COPD is a general term that can be used to describe many problems that cause inflammation of the lungs and limit airflow. These conditions include chronic bronchitis and emphysema. The main symptom of COPD is shortness of breath, which makes it harder to do even simple tasks. This can also make it harder to exercise and stay active. Talk with your health care provider about treatments to help you breathe better and actions you can take to prevent breathing problems during physical activity. What are the benefits of exercising when you have COPD? Exercising regularly is an important part of a healthy lifestyle. You can still exercise and do physical activities even though you have COPD. Exercise and physical activity improve your shortness of breath by increasing blood flow (circulation). This causes your heart to pump more oxygen through your body. Moderate exercise can: Improve oxygen use. Increase your energy level. Help with shortness of breath. Strengthen your breathing muscles. Improve heart health. Help with sleep. Improve your self-esteem and feelings of self-worth. Lower depression, stress, and anxiety. Exercise can benefit everyone with COPD. The severity of your disease may affect how hard you can exercise, especially at first, but everyone can benefit. Talk with your health care provider about how much exercise is safe for you, and which activities and exercises are safe for you. What actions can I take to prevent breathing problems during physical activity? Sign up for a pulmonary rehabilitation program. This type of program may include: Education about lung diseases. Exercise classes that teach you how to exercise and be more active while improving your breathing. This usually involves: Exercise using your lower extremities, such as a stationary  bicycle. About 30 minutes of exercise, 2 to 5 times per week, for 6 to 12 weeks. Strength training, such as push-ups or leg lifts. Nutrition education. Group classes in which you can talk with others who also have COPD and learn ways to manage stress. If you use an oxygen tank, you should use it while you exercise. Work with your health care provider to adjust your oxygen for your physical activity. Your resting flow rate is different from your flow rate during physical activity. How to manage your breathing while exercising While you are exercising: Take slow breaths. Pace yourself, and do nottry to go too fast. Purse your lips while breathing out. Pursing your lips is similar to a kissing or whistling position. If doing exercise that uses a quick burst of effort, such as weight lifting: Breathe in before starting the exercise. Breathe out during the hardest part of the exercise, such as raising the weights. Where to find support You can find support for exercising with COPD from: Your health care provider. A pulmonary rehabilitation program. Your local health department or community health programs. Support groups, either online or in-person. Your health care provider may be able to recommend support groups. Where to find more information You can find more information about exercising with COPD from: American Lung Association: lung.org COPD Foundation: copdfoundation.org Contact a health care provider if: Your symptoms get worse. You have nausea. You have a fever. You want to start a new exercise program or a new activity. Get help right away if: You have chest pain. You cannot breathe. These symptoms may represent a serious problem that is an emergency. Do not wait to see if the symptoms will go away. Get medical help right away. Call  your local emergency services (911 in the [place] Do not drive yourself to the hospital. Summary COPD is a general term that can be used to describe  many different lung problems that cause lung inflammation and limit airflow. This includes chronic bronchitis and emphysema. Exercise and physical activity improve your shortness of breath by increasing blood flow (circulation). This causes your heart to provide more oxygen to your body. Contact your health care provider before starting any exercise program or new activity. Ask your health care provider what exercises and activities are safe for you. This information is not intended to replace advice given to you by your health care provider. Make sure you discuss any questions you have with your health care provider. Document Revised: 05/20/2023 Document Reviewed: 05/20/2023 Elsevier Patient Education  2024 [employer].
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
## 2024-04-17 ENCOUNTER — Ambulatory Visit: Payer: Self-pay | Admitting: Family Medicine

## 2024-04-17 DIAGNOSIS — R350 Frequency of micturition: Secondary | ICD-10-CM

## 2024-04-18 NOTE — Progress Notes (Signed)
 Called patient and no answer left vm to return call

## 2024-04-23 ENCOUNTER — Other Ambulatory Visit

## 2024-04-23 DIAGNOSIS — R350 Frequency of micturition: Secondary | ICD-10-CM | POA: Diagnosis not present

## 2024-04-24 ENCOUNTER — Ambulatory Visit: Payer: Self-pay | Admitting: Family Medicine

## 2024-04-24 LAB — URINE CULTURE
MICRO NUMBER:: 17060672
Result:: NO GROWTH
SPECIMEN QUALITY:: ADEQUATE

## 2024-04-25 ENCOUNTER — Encounter: Payer: Self-pay | Admitting: *Deleted

## 2024-10-15 ENCOUNTER — Ambulatory Visit: Admitting: Family Medicine
